# Patient Record
Sex: Male | Born: 1965 | Race: Black or African American | Hispanic: No | Marital: Married | Smoking: Current every day smoker
Health system: Southern US, Community
[De-identification: ages and names within clinical notes are randomized; demographics above are authoritative.]

## PROBLEM LIST (undated history)

## (undated) DIAGNOSIS — R040 Epistaxis: Secondary | ICD-10-CM

## (undated) DIAGNOSIS — I10 Essential (primary) hypertension: Secondary | ICD-10-CM

## (undated) DIAGNOSIS — E7800 Pure hypercholesterolemia, unspecified: Secondary | ICD-10-CM

## (undated) HISTORY — DX: Pure hypercholesterolemia, unspecified: E78.00

## (undated) HISTORY — DX: Epistaxis: R04.0

---

## 2006-05-12 ENCOUNTER — Emergency Department (HOSPITAL_COMMUNITY): Admission: EM | Admit: 2006-05-12 | Discharge: 2006-05-12 | Payer: Self-pay | Admitting: Emergency Medicine

## 2010-06-03 ENCOUNTER — Emergency Department (HOSPITAL_COMMUNITY)
Admission: EM | Admit: 2010-06-03 | Discharge: 2010-06-04 | Payer: Self-pay | Source: Home / Self Care | Admitting: Emergency Medicine

## 2012-04-25 ENCOUNTER — Emergency Department (HOSPITAL_COMMUNITY)
Admission: EM | Admit: 2012-04-25 | Discharge: 2012-04-25 | Disposition: A | Discharge status: Home / Self Care | Payer: BC Managed Care – PPO | Source: Home / Self Care | Attending: Family Medicine | Admitting: Family Medicine

## 2012-04-25 ENCOUNTER — Encounter (HOSPITAL_COMMUNITY): Payer: Self-pay | Admitting: *Deleted

## 2012-04-25 DIAGNOSIS — Z8709 Personal history of other diseases of the respiratory system: Secondary | ICD-10-CM

## 2012-04-25 DIAGNOSIS — Z87898 Personal history of other specified conditions: Secondary | ICD-10-CM

## 2012-04-25 DIAGNOSIS — I10 Essential (primary) hypertension: Secondary | ICD-10-CM

## 2012-04-25 MED ORDER — LISINOPRIL-HYDROCHLOROTHIAZIDE 10-12.5 MG PO TABS: 1.0000 | ORAL_TABLET | Freq: Every day | ORAL | Status: DC

## 2012-04-25 NOTE — ED Notes (Signed)
Pt  Reports  intermittant   Episodes  Over  Last  Few  Weeks  Of  Nosebleed    -  However  No  Nosebleed  X  6  Days  -  He  Also  Reports  Some  dizzyness  At  Times   As well  As  Pain in l  Arm  When  Moves

## 2012-04-25 NOTE — ED Provider Notes (Signed)
History     CSN: [id number]  Arrival date & time 04/25/12  1000   First MD Initiated Contact with Patient 04/25/12 1006      Chief Complaint  Patient presents with  . Epistaxis    (Consider location/radiation/quality/duration/timing/severity/associated sxs/prior treatment) Patient is a 46 y.o. male presenting with nosebleeds. The history is provided by the patient and the spouse.  Epistaxis  This is a new problem. The current episode started more than 1 week ago (off and on over past sev weeks.,none in1 week.). The problem has been resolved. The bleeding has been from the left nare. His past medical history does not include bleeding disorder, colds, nose-picking or frequent nosebleeds.    History reviewed. No pertinent past medical history.  History reviewed. No pertinent past surgical history.  History reviewed. No pertinent family history.  History  Substance Use Topics  . Smoking status: Not on file  . Smokeless tobacco: Not on file  . Alcohol Use: Not on file      Review of Systems  Constitutional: Negative.   HENT: Positive for nosebleeds.     Allergies  Review of patient's allergies indicates no known allergies.  Home Medications   Current Outpatient Rx  Name  Route  Sig  Dispense  Refill  . LISINOPRIL-HYDROCHLOROTHIAZIDE 10-12.5 MG PO TABS   Oral   Take 1 tablet by mouth daily.   30 tablet   1     BP 162/99  Pulse 108  Temp 97.9 F (36.6 C) (Oral)  Resp 20  SpO2 97%  Physical Exam  Nursing note and vitals reviewed. Constitutional: He is oriented to person, place, and time. He appears well-developed and well-nourished.  HENT:  Head: Normocephalic.  Right Ear: External ear normal.  Left Ear: External ear normal.  Nose: Mucosal edema and rhinorrhea present. No epistaxis.  Mouth/Throat: Oropharynx is clear and moist and mucous membranes are normal.  Cardiovascular: Normal rate, regular rhythm, normal heart sounds and intact distal pulses.     Pulmonary/Chest: Effort normal and breath sounds normal.  Neurological: He is alert and oriented to person, place, and time.  Skin: Skin is warm and dry.    ED Course  Procedures (including critical care time)  Labs Reviewed - No data to display No results found.   1. History of epistaxis   2. Hypertension, benign       MDM          Linna Hoff, MD 04/25/12 1121

## 2012-08-03 ENCOUNTER — Emergency Department (HOSPITAL_COMMUNITY): Payer: BC Managed Care – PPO

## 2012-08-03 ENCOUNTER — Emergency Department (HOSPITAL_COMMUNITY)
Admission: EM | Admit: 2012-08-03 | Discharge: 2012-08-03 | Disposition: A | Discharge status: Home / Self Care | Payer: BC Managed Care – PPO | Attending: Emergency Medicine | Admitting: Emergency Medicine

## 2012-08-03 ENCOUNTER — Encounter (HOSPITAL_COMMUNITY): Payer: Self-pay | Admitting: Emergency Medicine

## 2012-08-03 DIAGNOSIS — R5381 Other malaise: Secondary | ICD-10-CM | POA: Insufficient documentation

## 2012-08-03 DIAGNOSIS — I959 Hypotension, unspecified: Secondary | ICD-10-CM | POA: Insufficient documentation

## 2012-08-03 DIAGNOSIS — Z79899 Other long term (current) drug therapy: Secondary | ICD-10-CM | POA: Insufficient documentation

## 2012-08-03 DIAGNOSIS — F17200 Nicotine dependence, unspecified, uncomplicated: Secondary | ICD-10-CM | POA: Insufficient documentation

## 2012-08-03 DIAGNOSIS — I10 Essential (primary) hypertension: Secondary | ICD-10-CM | POA: Insufficient documentation

## 2012-08-03 DIAGNOSIS — R040 Epistaxis: Secondary | ICD-10-CM | POA: Insufficient documentation

## 2012-08-03 DIAGNOSIS — I951 Orthostatic hypotension: Secondary | ICD-10-CM

## 2012-08-03 HISTORY — DX: Essential (primary) hypertension: I10

## 2012-08-03 LAB — COMPREHENSIVE METABOLIC PANEL
ALT: 15 U/L (ref 0–53)
Albumin: 3.9 g/dL (ref 3.5–5.2)
CO2: 25 mEq/L (ref 19–32)
Creatinine, Ser: 1.08 mg/dL (ref 0.50–1.35)
GFR calc Af Amer: >90 mL/min (ref >90)
GFR calc non Af Amer: 79 mL/min — ABNORMAL LOW (ref >90)
Potassium: 3.8 mEq/L (ref 3.5–5.1)
Total Bilirubin: 0.3 mg/dL (ref 0.3–1.2)
Total Protein: 8 g/dL (ref 6.0–8.3)

## 2012-08-03 LAB — CBC WITH DIFFERENTIAL/PLATELET
Eosinophils Relative: 1 % (ref 0–5)
MCH: 29.8 pg (ref 26.0–34.0)
MCHC: 35.4 g/dL (ref 30.0–36.0)
MCV: 84.3 fL (ref 78.0–100.0)
Monocytes Relative: 6 % (ref 3–12)
Neutro Abs: 7.2 10*3/uL (ref 1.7–7.7)
Neutrophils Relative %: 64 % (ref 43–77)
Platelets: 295 10*3/uL (ref 150–400)
RDW: 16.3 % — ABNORMAL HIGH (ref 11.5–15.5)

## 2012-08-03 LAB — RAPID URINE DRUG SCREEN, HOSP PERFORMED: Tetrahydrocannabinol: NOT DETECTED

## 2012-08-03 LAB — URINALYSIS, ROUTINE W REFLEX MICROSCOPIC
Glucose, UA: NEGATIVE mg/dL
Hgb urine dipstick: NEGATIVE
Ketones, ur: NEGATIVE mg/dL
Nitrite: NEGATIVE
Protein, ur: 30 mg/dL — AB
Specific Gravity, Urine: 1.026 (ref 1.005–1.030)

## 2012-08-03 LAB — URINE MICROSCOPIC-ADD ON

## 2012-08-03 NOTE — ED Provider Notes (Signed)
History     CSN: [id number]  Arrival date & time 08/03/12  1229   First MD Initiated Contact with Patient 08/03/12 1305      Chief Complaint  Patient presents with  . Dizziness  . Epistaxis    (Consider location/radiation/quality/duration/timing/severity/associated sxs/prior treatment) HPI Comments: Patient c/o intermittent nosebleeds since November as well as intermittent lightheadedness since January 1 after he hit his head. Denies CP or SOB.  States his dizziness is worse with standing and better with sitting.  No nausea or vomiting. No focal weakness, numbness, tingling.   States he had nosebleeds on and off for the past few months.  Seem to have improved after being started on BP meds by urgent care.  The history is provided by the patient.    Past Medical History  Diagnosis Date  . Hypertension     History reviewed. No pertinent past surgical history.  History reviewed. No pertinent family history.  History  Substance Use Topics  . Smoking status: Current Every Day Smoker  . Smokeless tobacco: Not on file  . Alcohol Use: Yes      Review of Systems  Constitutional: Negative for fever, activity change and appetite change.  HENT: Positive for nosebleeds.   Respiratory: Negative for cough, chest tightness and shortness of breath.   Cardiovascular: Negative for chest pain.  Gastrointestinal: Negative for nausea, vomiting and abdominal pain.  Musculoskeletal: Negative for back pain.  Skin: Negative for rash.  Neurological: Positive for dizziness, weakness and light-headedness. Negative for numbness and headaches.  A complete 10 system review of systems was obtained and all systems are negative except as noted in the HPI and PMH.    Allergies  Review of patient's allergies indicates no known allergies.  Home Medications   Current Outpatient Rx  Name  Route  Sig  Dispense  Refill  . lisinopril-hydrochlorothiazide (PRINZIDE,ZESTORETIC) 10-12.5 MG per tablet  Oral   Take 1 tablet by mouth daily.   30 tablet   1     BP 140/93  Pulse 92  Temp(Src) 98.5 F (36.9 C) (Oral)  Resp 18  SpO2 97%  Physical Exam  Constitutional: He is oriented to person, place, and time. He appears well-developed and well-nourished. No distress.  HENT:  Head: Normocephalic and atraumatic.  Mouth/Throat: Oropharynx is clear and moist. No oropharyngeal exudate.  No evidence of recent epistaxis  Eyes: Conjunctivae and EOM are normal. Pupils are equal, round, and reactive to light.  Neck: Normal range of motion. Neck supple.  Cardiovascular: Normal rate, regular rhythm and normal heart sounds.   Pulmonary/Chest: Effort normal and breath sounds normal. No respiratory distress. He has no wheezes.  Abdominal: Soft. There is no tenderness. There is no rebound and no guarding.  Musculoskeletal: Normal range of motion. He exhibits no edema and no tenderness.  Neurological: He is alert and oriented to person, place, and time. No cranial nerve deficit. He exhibits normal muscle tone. Coordination normal.  cranial nerves 2-12 intact, 5 out of 5 strength throughout, no ataxia on finger to nose, no pronator drift, negative Romberg, normal gait  Skin: Skin is warm.    ED Course  Procedures (including critical care time)  Labs Reviewed  CBC WITH DIFFERENTIAL - Abnormal; Notable for the following:    WBC 11.2 (*)    RDW 16.3 (*)    All other components within normal limits  COMPREHENSIVE METABOLIC PANEL - Abnormal; Notable for the following:    Glucose, Bld 130 (*)  GFR calc non Af Amer 79 (*)    All other components within normal limits  URINALYSIS, ROUTINE W REFLEX MICROSCOPIC - Abnormal; Notable for the following:    Protein, ur 30 (*)    All other components within normal limits  URINE MICROSCOPIC-ADD ON - Abnormal; Notable for the following:    Casts HYALINE CASTS (*)    All other components within normal limits  TROPONIN I  URINE RAPID DRUG SCREEN (HOSP  PERFORMED)   Ct Head Wo Contrast  08/03/2012  *RADIOLOGY REPORT*  Clinical Data: Dizziness.  Epistaxis.  CT HEAD WITHOUT CONTRAST  Technique:  Contiguous axial images were obtained from the base of the skull through the vertex without contrast.  Comparison: None.  Findings: No mass lesion, mass effect, midline shift, hydrocephalus, hemorrhage.  No territorial ischemia or acute infarction.  There is near-complete opacification of the visualized portions of the right maxillary sinus.  Mucoperiosteal thickening of the left maxillary sinus.  Bilateral frontal sinus and scattered ethmoid sinus disease is also present.  Minimal sinus disease of the sphenoid sinuses.  Mastoid air cells are clear.  IMPRESSION: Negative CT brain.  Chronic-appearing paranasal sinus disease, worst in the right maxillary sinus.   Original Report Authenticated By: Andreas Newport, M.D.      No diagnosis found.    MDM  Patient complains of intermittent nosebleeds since Thanksgiving as well as intermittent dizziness since January 1. Denies any chest pain, shortness of breath, abdominal pain, nausea vomiting.  Neurological exam is nonfocal. Patient's blood pressure is acceptable. He looks well-hydrated and in no distress and nontoxic. Orthostatics positive by heart rate. No vomiting in the ED. Patient tolerating by mouth fluids. Workup unremarkable with normal electrolytes. Needs to establish care with PCP to address hypertension.   Date: 08/03/2012  Rate: 87  Rhythm: normal sinus rhythm  QRS Axis: normal  Intervals: normal  ST/T Wave abnormalities: normal  Conduction Disutrbances:none  Narrative Interpretation:   Old EKG Reviewed: none available    Glynn Octave, MD 08/03/12 1727

## 2012-08-03 NOTE — ED Notes (Signed)
Patient transported to CT 

## 2012-08-03 NOTE — ED Notes (Signed)
H/a and dizziness  X 2 months nose bleed for 2 months also

## 2012-08-03 NOTE — ED Notes (Signed)
Nosebleeds ongoing since November. Nosebleeds recurrent. Dizziness has increased over past few days, started after hitting head on New years.

## 2012-08-03 NOTE — ED Notes (Signed)
Pt placed on monitor, continuous pulse oximetry and blood pressure cuff; family at bedside 

## 2012-08-07 ENCOUNTER — Ambulatory Visit (INDEPENDENT_AMBULATORY_CARE_PROVIDER_SITE_OTHER): Payer: BC Managed Care – PPO | Admitting: Internal Medicine

## 2012-08-07 ENCOUNTER — Encounter: Payer: Self-pay | Admitting: Internal Medicine

## 2012-08-07 VITALS — BP 135/82 | HR 98 | Temp 97.2°F | Ht 72.0 in | Wt 192.8 lb

## 2012-08-07 DIAGNOSIS — F17200 Nicotine dependence, unspecified, uncomplicated: Secondary | ICD-10-CM

## 2012-08-07 DIAGNOSIS — R040 Epistaxis: Secondary | ICD-10-CM

## 2012-08-07 DIAGNOSIS — Z13220 Encounter for screening for lipoid disorders: Secondary | ICD-10-CM

## 2012-08-07 DIAGNOSIS — Z720 Tobacco use: Secondary | ICD-10-CM | POA: Insufficient documentation

## 2012-08-07 HISTORY — DX: Epistaxis: R04.0

## 2012-08-07 LAB — PROTIME-INR
INR: 0.87 (ref <1.50)
Prothrombin Time: 11.8 seconds (ref 11.6–15.2)

## 2012-08-07 LAB — LIPID PANEL
Cholesterol: 245 mg/dL — ABNORMAL HIGH (ref 0–200)
HDL: 46 mg/dL (ref >39)
Total CHOL/HDL Ratio: 5.3 Ratio

## 2012-08-07 LAB — APTT: aPTT: 29 seconds (ref 24–37)

## 2012-08-07 MED ORDER — SALINE SPRAY 0.65 % NA SOLN: 1.0000 | NASAL | Status: DC | PRN

## 2012-08-07 NOTE — Patient Instructions (Addendum)
General Instructions:  1) Have your blood pressure checked at work occasionally.  If top number consistently greater than 140 or bottom number greater than 80, give the clinic a call. 2) Use a saline nose spray into both nostrils daily.  If bleeding continues to be a problem, give the clinic a call. 3) Stop smoking. 4) Stop taking lisinopril/HCTZ. You do not need to be on blood pressure medicine right now.   Treatment Goals:  Goals (1 Years of Data) as of 08/07/12     Lifestyle    . Quit smoking / using tobacco       Progress Toward Treatment Goals:  Treatment Goal 08/07/2012  Stop smoking smoking the same amount

## 2012-08-07 NOTE — Assessment & Plan Note (Signed)
Assessment:  Progress toward smoking cessation:  smoking the same amount  Barriers to progress toward smoking cessation:  withdrawal symptoms  Plan:  Instruction/counseling given:  I counseled patient on the dangers of tobacco use and advised patient to stop smoking.  Educational resources provided:  smoking cessation resource list;[facility] (1-800-QUIT-NOW) brochure Patient agreed to the following self-care plans for smoking cessation:  go to the [employer] ([employer])

## 2012-08-07 NOTE — Progress Notes (Addendum)
Subjective:    Patient ID: Zachary Campos, male    DOB: 19-Aug-1965, 47 y.o.   MRN: [id number]  HPI:  47 year old man presenting as a new patient to clinic. Began experiencing nosebleeds in November 2013. Nosebleeds were occurring 2-3 times a day. He was seen in urgent care and diagnosed with high blood pressure. He was prescribed lisinopril/hydrochlorothiazide. This medicine caused nausea and dizziness. He was recently seen and the emergency department and diagnosed with orthostatic hypotension. He has not been taking his blood pressure medicine for the past week. Nosebleeds have decreased to 1 nosebleeds every other week. Nosebleeds last 10-15 minutes before the bleeding stops. Dizziness and nausea have resolved since stopping the high blood pressure medicine. No other symptoms.   Review of Systems  Constitutional: Negative.  Negative for fever and chills.  HENT: Positive for nosebleeds and congestion. Negative for hearing loss and ear pain.   Eyes: Negative.  Negative for visual disturbance.  Respiratory: Negative.  Negative for cough and shortness of breath.   Cardiovascular: Negative.  Negative for chest pain.  Gastrointestinal: Negative.  Negative for nausea, vomiting, abdominal pain, diarrhea, constipation and blood in stool.  Endocrine: Negative.  Negative for cold intolerance and heat intolerance.  Genitourinary: Negative for dysuria and hematuria.  Neurological: Negative.  Negative for dizziness, syncope, weakness, light-headedness, numbness and headaches.  Hematological: Negative.  Negative for adenopathy. Does not bruise/bleed easily.   Medical History No pertinent medical history  Surgical History No prior surgeries  Allergies No known drug allergies  Medications Not on any medications  Social History Social History  . Marital Status: Married    Spouse Name: N/A    Number of Children: 1  . Years of Education: N/A   Occupational History  . manufacturing    Social  History Main Topics  . Smoking status: Current Every Day Smoker -- 0.50 packs/day    Types: Cigarettes  . Smokeless tobacco: None  . Alcohol Use: Yes     Comment: averages 1 40oz beer every evening  . Drug Use: No  . Sexually Active: None   Social History Narrative   Works at a [employer], commissioning.    Family History  Problem Relation Age of Onset  . Bladder Cancer Mother   . Hypertension Mother   . Hyperlipidemia Mother   . Heart disease Neg Hx   . Stroke Neg Hx   . Diabetes Neg Hx     Family Status  Relation Status Death Age  . Mother Deceased 59  . Father Alive     age 51 (08/07/2012)  . Son Alive     age 35 (08/07/2012)        Objective:   Physical Exam:  GENERAL: well developed, well nourished; no acute distress HEAD: atraumatic, normocephalic EYES: pupils equal, round and reactive; sclera anicteric; normal conjunctiva EARS: canals patent and TMs normal bilaterally NOSE/THROAT: oropharynx clear, moist mucous membranes, pink gums, several teeth missing NECK: supple, thyroid normal in size and without palpable nodules LYMPH: no cervical or supraclavicular lymphadenopathy LUNGS: clear to auscultation bilaterally, normal work of breathing HEART: normal rate and regular rhythm; normal S1 and S2 without S3 or S4; no murmurs, rubs, or clicks PULSES: radial 2+ and symmetric ABDOMEN: soft, non-tender, normal bowel sounds MOTOR: 5 out of 5 dorsiflexion and grip strength bilaterally SENSATION: Intact, no deficits REFLEXES: 1+ patellar reflexes bilaterally CRANIAL NERVES: pupils reactive to light bilaterally; extra occular muscles are intact, smile is symmetric, uvula is midline and palate elevates  symmetrically, tongue protrudes midline. SKIN: warm, dry, intact, normal turgor, no rashes EXTREMITIES: no peripheral edema, clubbing, or cyanosis PSYCH: patient is alert and oriented, mood and affect are normal and congruent  Filed Vitals:   08/07/12 0841  BP:  135/82  Pulse: 98  Temp: 97.2 F (36.2 C)    BP Readings from Last 3 Encounters:  08/07/12 135/82  08/03/12 140/93  04/25/12 162/99       Assessment & Plan:   Lipid panel today for risk stratification. All other health maintenance and preventive measures up to date.  ADDENDUM: Lipid panel returned, showing hypercholesterolemia. 10 year or a vascular disease risk calculated at 9.7%. Will speak to the patient about starting pravastatin.  Lipid Panel     Component Value Date/Time   CHOL 245* 08/07/2012 0925   TRIG 287* 08/07/2012 0925   HDL 46 08/07/2012 0925   CHOLHDL 5.3 08/07/2012 0925   VLDL 57* 08/07/2012 0925   LDLCALC 142* 08/07/2012 1610

## 2012-08-07 NOTE — Assessment & Plan Note (Addendum)
Bleeding has decreased in frequency to once every other week. Bleeding stops and a normal amount of time. Do not suspect bleeding diathesis. We'll check PT and PTT today. Platelet count normal. Instructed patient to use saline nasal spray in both nares every day. If bleeding continues to be a problem, we will refer to ENT. - Saline nasal spray - PT and PTT  ADDENDUM: PT and PTT are normal.  Coagulation studies, 08/07/2012 Test Result  PT  11.8   INR  0.87   PTT  29

## 2012-08-08 ENCOUNTER — Encounter: Payer: Self-pay | Admitting: Internal Medicine

## 2012-08-08 ENCOUNTER — Telehealth: Payer: Self-pay | Admitting: Internal Medicine

## 2012-08-08 DIAGNOSIS — E7800 Pure hypercholesterolemia, unspecified: Secondary | ICD-10-CM

## 2012-08-08 DIAGNOSIS — E785 Hyperlipidemia, unspecified: Secondary | ICD-10-CM | POA: Insufficient documentation

## 2012-08-08 HISTORY — DX: Pure hypercholesterolemia, unspecified: E78.00

## 2012-08-08 MED ORDER — PRAVASTATIN SODIUM 40 MG PO TABS: 40.0000 mg | ORAL_TABLET | Freq: Every evening | ORAL | Status: DC

## 2012-08-08 NOTE — Telephone Encounter (Signed)
I called the patient regarding his recent lipid panel. His total cholesterol and LDL were both elevated. His calculated 10 year cardiovascular disease risk is 9.7%. I again encouraged him to stop smoking as this would drop his risk to 5.7%, but in the interim I suggested we start a cholesterol-lowering medicine. He agreed with this plan, and we will start treatment with pravastatin 40 mg daily. Recent liver function testing was normal.

## 2012-08-08 NOTE — Addendum Note (Signed)
Addended by: Gwynn Burly B on: 08/08/2012 08:19 AM   Modules accepted: Medications

## 2012-08-18 ENCOUNTER — Encounter: Payer: Self-pay | Admitting: Internal Medicine

## 2016-03-07 ENCOUNTER — Emergency Department (HOSPITAL_COMMUNITY): Payer: BC Managed Care – PPO

## 2016-03-07 ENCOUNTER — Encounter (HOSPITAL_COMMUNITY): Payer: Self-pay | Admitting: Emergency Medicine

## 2016-03-07 ENCOUNTER — Emergency Department (HOSPITAL_COMMUNITY)
Admission: EM | Admit: 2016-03-07 | Discharge: 2016-03-07 | Disposition: A | Discharge status: Home / Self Care | Payer: BC Managed Care – PPO | Attending: Emergency Medicine | Admitting: Emergency Medicine

## 2016-03-07 DIAGNOSIS — F17210 Nicotine dependence, cigarettes, uncomplicated: Secondary | ICD-10-CM | POA: Diagnosis not present

## 2016-03-07 DIAGNOSIS — R0602 Shortness of breath: Secondary | ICD-10-CM | POA: Diagnosis present

## 2016-03-07 DIAGNOSIS — J069 Acute upper respiratory infection, unspecified: Secondary | ICD-10-CM | POA: Insufficient documentation

## 2016-03-07 DIAGNOSIS — B9789 Other viral agents as the cause of diseases classified elsewhere: Secondary | ICD-10-CM

## 2016-03-07 LAB — RAPID STREP SCREEN (MED CTR MEBANE ONLY): STREPTOCOCCUS, GROUP A SCREEN (DIRECT): NEGATIVE

## 2016-03-07 MED ORDER — ALBUTEROL SULFATE (2.5 MG/3ML) 0.083% IN NEBU
5.0000 mg | INHALATION_SOLUTION | Freq: Once | RESPIRATORY_TRACT | Status: DC
  Filled 2016-03-07: qty 6

## 2016-03-07 MED ORDER — BENZONATATE 100 MG PO CAPS
100.0000 mg | ORAL_CAPSULE | Freq: Once | ORAL | Status: AC
  Administered 2016-03-07: 100 mg via ORAL
  Filled 2016-03-07: qty 1

## 2016-03-07 MED ORDER — BENZONATATE 100 MG PO CAPS: 100.0000 mg | ORAL_CAPSULE | Freq: Three times a day (TID) | ORAL | 0 refills | Status: AC

## 2016-03-07 NOTE — ED Notes (Signed)
Patient verbalized understanding of discharge instructions and denies any further needs or questions at this time. VS stable. Patient ambulatory with steady gait. RN escorted to ED entrance in wheelchair.   

## 2016-03-07 NOTE — ED Notes (Signed)
Pt transported to xray 

## 2016-03-07 NOTE — ED Triage Notes (Addendum)
Pt c/o couple days of fever, sore throat, cough, and shortness of breath. 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 Pt has tried over the counter medications without relief.

## 2016-03-07 NOTE — ED Provider Notes (Signed)
[facility]-EMERGENCY DEPT Provider Note   CSN: [id number] Arrival date & time: 03/07/16  1646     History   Chief Complaint Chief Complaint  Patient presents with  . Shortness of Breath    HPI Zachary Campos is a 50 y.o. male.  The history is provided by the patient and the spouse.  Cough  This is a new problem. The current episode started more than 2 days ago (5 days). The problem occurs hourly. The problem has been gradually worsening. The cough is productive of sputum (green, nonbloody). There has been no fever. Associated symptoms include chills, rhinorrhea, sore throat and shortness of breath. Pertinent negatives include no chest pain, no headaches and no myalgias. He is a smoker.    Past Medical History:  Diagnosis Date  . Epistaxis 08/07/2012  . Hypercholesterolemia 08/08/2012   Lipid panel 08/07/2012: Cholesterol 245, triglycerides 287, HDL 46, LDL 142 (10-yr CVD risk 9.7%).     Patient Active Problem List   Diagnosis Date Noted  . Hypercholesterolemia 08/08/2012  . Epistaxis 08/07/2012  . Tobacco use disorder 08/07/2012    History reviewed. No pertinent surgical history.     Home Medications    Prior to Admission medications   Medication Sig Start Date End Date Taking? Authorizing Provider  benzonatate (TESSALON) 100 MG capsule Take 1 capsule (100 mg total) by mouth 3 (three) times daily. 03/07/16 03/10/16  Horald Pollen, MD  pravastatin (PRAVACHOL) 40 MG tablet Take 1 tablet (40 mg total) by mouth every evening. 08/08/12 08/08/13  Lollie Sails, MD  sodium chloride (OCEAN) 0.65 % SOLN nasal spray Place 1 spray into the nose as needed (epistaxis). 08/07/12   Lollie Sails, MD    Family History Family History  Problem Relation Age of Onset  . Bladder Cancer Mother   . Hypertension Mother   . Hyperlipidemia Mother   . Heart disease Neg Hx   . Stroke Neg Hx   . Diabetes Neg Hx     Social History Social History  Substance Use Topics  . Smoking status:  Current Every Day Smoker    Packs/day: 0.50    Types: Cigarettes  . Smokeless tobacco: Never Used  . Alcohol use Yes     Comment: averages 1 40oz beer every evening     Allergies   Review of patient's allergies indicates no known allergies.   Review of Systems Review of Systems  Constitutional: Positive for chills. Negative for fever.  HENT: Positive for congestion, rhinorrhea, sinus pressure and sore throat. Negative for trouble swallowing and voice change.   Respiratory: Positive for cough and shortness of breath. Negative for chest tightness.   Cardiovascular: Negative for chest pain and leg swelling.  Gastrointestinal: Negative for abdominal pain, nausea and vomiting.  Genitourinary: Negative for flank pain.  Musculoskeletal: Negative for arthralgias, myalgias, neck pain and neck stiffness.  Skin: Negative for rash.  Neurological: Negative for headaches.  Psychiatric/Behavioral: Negative for confusion.     Physical Exam Updated Vital Signs BP 134/99   Pulse 80   Temp 97.8 F (36.6 C) (Oral)   Resp 21   Ht 5\' 11"  (1.803 m)   Wt 87.7 kg   SpO2 95%   BMI 26.97 kg/m   Physical Exam  Constitutional: He is oriented to person, place, and time. He appears well-developed and well-nourished. No distress.  Pleasant, cooperative, well-appearing  HENT:  Head: Normocephalic and atraumatic.  Erythematous streaking of posterior oropharynx, mildly boggy nasal turbinates. Symmetric palate rise, normal  voice  Eyes: Conjunctivae are normal. No scleral icterus.  Neck: Normal range of motion. Neck supple. No JVD present.  Cardiovascular: Normal rate and regular rhythm.   No murmur heard. Pulmonary/Chest: Effort normal and breath sounds normal. No respiratory distress.  Abdominal: Soft. He exhibits no distension. There is no tenderness.  Musculoskeletal: Normal range of motion. He exhibits no edema or tenderness.  Neurological: He is alert and oriented to person, place, and time.  He exhibits normal muscle tone. Coordination normal.  Skin: Skin is warm and dry. Capillary refill takes less than 2 seconds. No rash noted. He is not diaphoretic.  Psychiatric: He has a normal mood and affect.  Nursing note and vitals reviewed.    ED Treatments / Results  Labs (all labs ordered are listed, but only abnormal results are displayed) Labs Reviewed  RAPID STREP SCREEN (NOT AT [facility])  CULTURE, GROUP A STREP [facility])    EKG  EKG Interpretation  Date/Time:  Sunday March 07 2016 16:51:09 EDT Ventricular Rate:  108 PR Interval:  144 QRS Duration: 76 QT Interval:  326 QTC Calculation: 436 R Axis:   59 Text Interpretation:  Sinus tachycardia Otherwise normal ECG No significant change since last tracing Confirmed by FLOYD MD, DANIEL [phone]) on 03/07/2016 5:04:33 PM       Radiology Dg Chest 2 View  Result Date: 03/07/2016 CLINICAL DATA:  Fever, productive cough. EXAM: CHEST  2 VIEW COMPARISON:  Radiographs of June 03, 2010 and May 12, 2006. FINDINGS: The heart size and mediastinal contours are within normal limits. Prominent oval-shaped density is seen on lateral radiograph only ; no corresponding abnormality is seen on AP radiographs. No pneumothorax or pleural effusion is noted. The visualized skeletal structures are unremarkable. IMPRESSION: Prominent oval-shaped density seen on lateral radiograph. This may simply represent normal vascular marking, but it is not clearly identified on prior radiographs. This may represent focal infiltrate, but neoplasm cannot be excluded. CT scan of the chest is recommended for further evaluation. Electronically Signed   By: Lupita Raider, M.D.   On: 03/07/2016 17:32    Procedures Procedures (including critical care time)  Medications Ordered in ED Medications  benzonatate (TESSALON) capsule 100 mg (100 mg Oral Given 03/07/16 1819)     Initial Impression / Assessment and Plan / ED Course  I have reviewed the triage vital  signs and the nursing notes.  Pertinent labs & imaging results that were available during my care of the patient were reviewed by me and considered in my medical decision making (see chart for details).  Clinical Course   AN SCHNABEL is a 50 y.o. male with h/o current smoker who presents to ED for 5 days of cough productive of green phlegm, cold, congestion, chills, postnasal drip, sore throat, concerning for viral URI with cough. Normal voice, doubt RPA. No evidence of PTA on exam. 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 CXR without evidence of pneumonia, however, demonstrates lesion on lateral view that warrants outpatient CT scan. Told patient and his wife about this, they demonstrate understanding that this needs further imaging as outpatient. Advised to return to ER for chest pain, shortness of breath, fevers, or any other new, worse, or concerning symptoms. They demonstrate understanding of this and comfort with d/c home.  Pt condition, course, and discharge were discussed with attending physician Dr. Melene Plan.  Final Clinical Impressions(s) / ED Diagnoses   Final diagnoses:  Viral URI with cough    New Prescriptions Discharge Medication List as of 03/07/2016  7:22 PM    START taking these medications   Details  benzonatate (TESSALON) 100 MG capsule Take 1 capsule (100 mg total) by mouth 3 (three) times daily., Starting Sun 03/07/2016, Until Wed 03/10/2016, Print         Horald PollenAudrey Dagny Fiorentino, MD 03/08/16 [id number]    Melene Planan Floyd, DO 03/08/16 2106

## 2016-03-10 LAB — CULTURE, GROUP A STREP (THRC)

## 2017-02-28 ENCOUNTER — Encounter (HOSPITAL_COMMUNITY): Payer: Self-pay | Admitting: *Deleted

## 2017-02-28 ENCOUNTER — Emergency Department (HOSPITAL_COMMUNITY): Payer: BC Managed Care – PPO

## 2017-02-28 ENCOUNTER — Emergency Department (HOSPITAL_COMMUNITY)
Admission: EM | Admit: 2017-02-28 | Discharge: 2017-02-28 | Disposition: A | Discharge status: Home / Self Care | Payer: BC Managed Care – PPO | Attending: Emergency Medicine | Admitting: Emergency Medicine

## 2017-02-28 DIAGNOSIS — W19XXXA Unspecified fall, initial encounter: Secondary | ICD-10-CM

## 2017-02-28 DIAGNOSIS — S2231XA Fracture of one rib, right side, initial encounter for closed fracture: Secondary | ICD-10-CM | POA: Insufficient documentation

## 2017-02-28 DIAGNOSIS — F17210 Nicotine dependence, cigarettes, uncomplicated: Secondary | ICD-10-CM | POA: Insufficient documentation

## 2017-02-28 DIAGNOSIS — Y929 Unspecified place or not applicable: Secondary | ICD-10-CM | POA: Diagnosis not present

## 2017-02-28 DIAGNOSIS — Y939 Activity, unspecified: Secondary | ICD-10-CM | POA: Diagnosis not present

## 2017-02-28 DIAGNOSIS — W109XXA Fall (on) (from) unspecified stairs and steps, initial encounter: Secondary | ICD-10-CM | POA: Insufficient documentation

## 2017-02-28 DIAGNOSIS — Y999 Unspecified external cause status: Secondary | ICD-10-CM | POA: Insufficient documentation

## 2017-02-28 MED ORDER — HYDROCODONE-ACETAMINOPHEN 5-325 MG PO TABS: 1.0000 | ORAL_TABLET | Freq: Three times a day (TID) | ORAL | 0 refills | Status: AC | PRN

## 2017-02-28 MED ORDER — HYDROCODONE-ACETAMINOPHEN 5-325 MG PO TABS
1.0000 | ORAL_TABLET | Freq: Once | ORAL | Status: AC
  Administered 2017-02-28: 1 via ORAL
  Filled 2017-02-28: qty 1

## 2017-02-28 MED ORDER — ACETAMINOPHEN 500 MG PO TABS: 1000.0000 mg | ORAL_TABLET | Freq: Three times a day (TID) | ORAL | 0 refills | Status: AC

## 2017-02-28 MED ORDER — LIDOCAINE 5 % EX OINT: 1.0000 "application " | TOPICAL_OINTMENT | Freq: Three times a day (TID) | CUTANEOUS | 0 refills | Status: DC | PRN

## 2017-02-28 MED ORDER — IBUPROFEN 600 MG PO TABS: 600.0000 mg | ORAL_TABLET | Freq: Three times a day (TID) | ORAL | 0 refills | Status: DC | PRN

## 2017-02-28 NOTE — ED Provider Notes (Signed)
[facility] Provider Note   CSN: [id number] Arrival date & time: 02/28/17  0709     History   Chief Complaint Chief Complaint  Patient presents with  . Fall  . Rib Injury    HPI NICKOLI BAGHERI is a 51 y.o. male.  HPI  51 year old male presents to the emergency department with right rib throbbing pain following a mechanical fall 5 days ago. Patient reports trying to get a lawnmower up 2 steps when he missed a step while trying to walk up, slipped and fell onto his right chest. He reports significant pain since fall which is exacerbated with movement and palpation. Has not found any relief. He reports that he has not been able to sleep due to the pain. Denies any head trauma or loss of consciousness. Patient is not on any anticoagulation. He denies any substernal chest pain, shortness of breath, abdominal pain, other injuries. Denies any other physical complaints. Past Medical History:  Diagnosis Date  . Epistaxis 08/07/2012  . Hypercholesterolemia 08/08/2012   Lipid panel 08/07/2012: Cholesterol 245, triglycerides 287, HDL 46, LDL 142 (10-yr CVD risk 9.7%).     Patient Active Problem List   Diagnosis Date Noted  . Hypercholesterolemia 08/08/2012  . Epistaxis 08/07/2012  . Tobacco use disorder 08/07/2012    History reviewed. No pertinent surgical history.     Home Medications    Prior to Admission medications   Medication Sig Start Date End Date Taking? Authorizing Provider  pravastatin (PRAVACHOL) 40 MG tablet Take 1 tablet (40 mg total) by mouth every evening. 08/08/12 08/08/13  Lollie Sails, MD  sodium chloride (OCEAN) 0.65 % SOLN nasal spray Place 1 spray into the nose as needed (epistaxis). 08/07/12   Lollie Sails, MD    Family History Family History  Problem Relation Age of Onset  . Bladder Cancer Mother   . Hypertension Mother   . Hyperlipidemia Mother   . Heart disease Neg Hx   . Stroke Neg Hx   . Diabetes Neg Hx     Social History Social  History  Substance Use Topics  . Smoking status: Current Every Day Smoker    Packs/day: 0.50    Types: Cigarettes  . Smokeless tobacco: Never Used  . Alcohol use Yes     Comment: averages 1 40oz beer every evening     Allergies   Patient has no known allergies.   Review of Systems Review of Systems All other systems are reviewed and are negative for acute change except as noted in the HPI   Physical Exam Updated Vital Signs BP (!) 139/98   Pulse 98   Temp 98.3 F (36.8 C) (Oral)   Resp 14   Ht 6' (1.829 m)   Wt 88 kg (194 lb)   SpO2 97%   BMI 26.31 kg/m   Physical Exam  Constitutional: He is oriented to person, place, and time. He appears well-developed and well-nourished. No distress.  HENT:  Head: Normocephalic.  Right Ear: External ear normal.  Left Ear: External ear normal.  Mouth/Throat: Oropharynx is clear and moist.  Eyes: Pupils are equal, round, and reactive to light. Conjunctivae and EOM are normal. Right eye exhibits no discharge. Left eye exhibits no discharge. No scleral icterus.  Neck: Normal range of motion. Neck supple.  Cardiovascular: Regular rhythm and normal heart sounds.  Exam reveals no gallop and no friction rub.   No murmur heard. Pulses:      Radial pulses are 2+ on  the right side, and 2+ on the left side.       Dorsalis pedis pulses are 2+ on the right side, and 2+ on the left side.  Pulmonary/Chest: Effort normal and breath sounds normal. No stridor. No respiratory distress. He exhibits tenderness. He exhibits no crepitus and no swelling.    Ecchymosis to the right lateral aspect of the chest wall about ribs 8, 9, 10.  Abdominal: Soft. He exhibits no distension. There is no tenderness.  Musculoskeletal:       Cervical back: He exhibits no bony tenderness.       Thoracic back: He exhibits no bony tenderness.       Lumbar back: He exhibits no bony tenderness.  Clavicle stable. Chest stable to AP/Lat compression. Pelvis stable to Lat  compression. No obvious extremity deformity. No abdominal wall contusion.  Neurological: He is alert and oriented to person, place, and time. GCS eye subscore is 4. GCS verbal subscore is 5. GCS motor subscore is 6.  Moving all extremities   Skin: Skin is warm. He is not diaphoretic.     ED Treatments / Results  Labs (all labs ordered are listed, but only abnormal results are displayed) Labs Reviewed - No data to display  EKG  EKG Interpretation None       Radiology Dg Ribs Unilateral W/chest Right  Result Date: 02/28/2017 CLINICAL DATA:  Fall at home on steps x 4 days ago and landed onto rt lateral rib area has severe rt lateral chest wall pain, sob worsening since, smokes approx 5-6 cigs per day EXAM: RIGHT RIBS AND CHEST - 3+ VIEW COMPARISON:  06/03/2010 FINDINGS: Nondisplaced fracture of the right posterior ninth rib. Small expansile bone lesion involving the right lateral tenth rib likely reflecting old posttraumatic deformity versus fibrous dysplasia or an enchondroma. There is no evidence of pneumothorax or pleural effusion. Both lungs are clear. Heart size and mediastinal contours are within normal limits. IMPRESSION: Nondisplaced fracture of the right posterior ninth rib. Electronically Signed   By: Elige Ko   On: 02/28/2017 08:26    Procedures Procedures (including critical care time)  Medications Ordered in ED Medications  HYDROcodone-acetaminophen (NORCO/VICODIN) 5-325 MG per tablet 1 tablet (not administered)     Initial Impression / Assessment and Plan / ED Course  I have reviewed the triage vital signs and the nursing notes.  Pertinent labs & imaging results that were available during my care of the patient were reviewed by me and considered in my medical decision making (see chart for details).     Nondisplaced right rib Fracture with overlying ecchymosis. No pneumonia, pneumothorax, pleural effusion on plain film. Patient provided with oral pain  medicine in the emergency department and an incentive spirometer. Discussed optimizing his pain control medicine at home.  The patient is safe for discharge with strict return precautions.   Final Clinical Impressions(s) / ED Diagnoses   Final diagnoses:  None   Disposition: Discharge  Condition: Good  I have discussed the results, Dx and Tx plan with the patient who expressed understanding and agree(s) with the plan. Discharge instructions discussed at great length. The patient was given strict return precautions who verbalized understanding of the instructions. No further questions at time of discharge.    New Prescriptions   ACETAMINOPHEN (TYLENOL) 500 MG TABLET    Take 2 tablets (1,000 mg total) by mouth every 8 (eight) hours. Do not take more than 4000 mg of acetaminophen (Tylenol) in a 24-hour period. Please  note that other medicines that you may be prescribed may have Tylenol as well.   HYDROCODONE-ACETAMINOPHEN (NORCO/VICODIN) 5-325 MG TABLET    Take 1 tablet by mouth every 8 (eight) hours as needed for severe pain (That is not improved by your scheduled acetaminophen (Tylenol) and as needed Ibuprofen regimen). Please do not exceed 4000 mg of acetaminophen (Tylenol) a 24-hour period. Please note that he may be prescribed additional medicine that contains acetaminophen.   IBUPROFEN (ADVIL,MOTRIN) 600 MG TABLET    Take 1 tablet (600 mg total) by mouth every 8 (eight) hours as needed (It scheduled Tylenol does not help with pain).   LIDOCAINE (XYLOCAINE) 5 % OINTMENT    Apply 1 application topically 3 (three) times daily as needed. Over your right chest injury      Cardama, Amadeo Garnet, MD 02/28/17 [phone]

## 2017-02-28 NOTE — ED Triage Notes (Addendum)
Pt sts he fell over some steps while mowing the lawn, 4 days ago, hitting his right side, he has significant bruising to the right side, sts it hurts to take a deep breath or cough. Pt reports unable to sleetp due to pain x 3 nights. Pt is taking advil at home without relief.

## 2018-06-27 ENCOUNTER — Ambulatory Visit (HOSPITAL_COMMUNITY)
Admission: EM | Admit: 2018-06-27 | Discharge: 2018-06-27 | Disposition: A | Discharge status: Home / Self Care | Payer: BC Managed Care – PPO | Attending: Family Medicine | Admitting: Family Medicine

## 2018-06-27 ENCOUNTER — Encounter (HOSPITAL_COMMUNITY): Payer: Self-pay

## 2018-06-27 DIAGNOSIS — F17210 Nicotine dependence, cigarettes, uncomplicated: Secondary | ICD-10-CM | POA: Diagnosis not present

## 2018-06-27 DIAGNOSIS — E785 Hyperlipidemia, unspecified: Secondary | ICD-10-CM

## 2018-06-27 DIAGNOSIS — Z720 Tobacco use: Secondary | ICD-10-CM | POA: Insufficient documentation

## 2018-06-27 DIAGNOSIS — R040 Epistaxis: Secondary | ICD-10-CM | POA: Insufficient documentation

## 2018-06-27 DIAGNOSIS — I10 Essential (primary) hypertension: Secondary | ICD-10-CM | POA: Diagnosis not present

## 2018-06-27 DIAGNOSIS — F17200 Nicotine dependence, unspecified, uncomplicated: Secondary | ICD-10-CM

## 2018-06-27 MED ORDER — LISINOPRIL-HYDROCHLOROTHIAZIDE 10-12.5 MG PO TABS: 1.0000 | ORAL_TABLET | Freq: Every day | ORAL | 1 refills | Status: DC

## 2018-06-27 MED ORDER — ATORVASTATIN CALCIUM 20 MG PO TABS: 20.0000 mg | ORAL_TABLET | Freq: Every day | ORAL | 1 refills | Status: DC

## 2018-06-27 NOTE — Discharge Instructions (Signed)
Take 1 pill a day of the hypertension medicine and the cholesterol medication Take at bedtime Continue to reduce salt in your diet Call tomorrow to set up an appointment with the PCP sometime in the next month

## 2018-06-27 NOTE — ED Triage Notes (Signed)
Pt states his blood pressure has been elevated for the past week and having head ahces and nosebleeds

## 2018-06-27 NOTE — ED Provider Notes (Signed)
[facility]    CSN: [id number] Arrival date & time: 06/27/18  1606     History   Chief Complaint Chief Complaint  Patient presents with  . Hypertension    HPI Zachary Campos is a 53 y.o. male.   HPI  This gentleman was sent home from work on Friday because of a nosebleed and hypertension.  The nurse told him it was not safe for him to be doing his job duties with his hypertension.  He states that he has had elevated blood pressure off and on since the summertime.  He has not obtained treatment.  He does not have a PCP.  He states that hypertension heart disease do run in his family.  Generally feels well, only rarely will have a headache or nosebleed.  No dizziness, visual symptoms, neuro symptoms. He was diagnosed with hyperlipidemia in 2014.  He was placed on a statin medication.  He does not recall the diagnosis with the medication, he was lost to follow-up. He is a cigarette smoker. I explained to the patient that is a smoker with hypertension and untreated hyperlipidemia he was at much increased risk of cardiovascular disease.  His cardiovascular risk profile based on the Framingham calculator is 36.9% risk over 10 years.  I explained to the patient this is a very high risk and that he needs to modify his behavior and obtain regular medical care for follow-up. He is also a heavy drinker.  We discussed that the medical recommendation is that he not drink more than 2 beers a day (12 ounce and not 40 ounce)  Past Medical History:  Diagnosis Date  . Epistaxis 08/07/2012  . Hypercholesterolemia 08/08/2012   Lipid panel 08/07/2012: Cholesterol 245, triglycerides 287, HDL 46, LDL 142 (10-yr CVD risk 9.7%).   . Hypertension     Patient Active Problem List   Diagnosis Date Noted  . Essential hypertension 06/27/2018  . Hyperlipidemia 08/08/2012  . Epistaxis 08/07/2012  . Tobacco abuse 08/07/2012    History reviewed. No pertinent surgical history.     Home Medications     Prior to Admission medications   Medication Sig Start Date End Date Taking? Authorizing Provider  atorvastatin (LIPITOR) 20 MG tablet Take 1 tablet (20 mg total) by mouth daily. 06/27/18   Eustace Moore, MD  lisinopril-hydrochlorothiazide (ZESTORETIC) 10-12.5 MG tablet Take 1 tablet by mouth daily. 06/27/18   Eustace Moore, MD    Family History Family History  Problem Relation Age of Onset  . Bladder Cancer Mother   . Hypertension Mother   . Hyperlipidemia Mother   . Heart disease Neg Hx   . Stroke Neg Hx   . Diabetes Neg Hx     Social History Social History   Tobacco Use  . Smoking status: Current Every Day Smoker    Packs/day: 0.50    Types: Cigarettes  . Smokeless tobacco: Never Used  Substance Use Topics  . Alcohol use: Yes    Comment: averages 1 40oz beer every evening  . Drug use: No     Allergies   Patient has no known allergies.   Review of Systems Review of Systems  Constitutional: Negative for chills and fever.  HENT: Positive for nosebleeds. Negative for ear pain and sore throat.   Eyes: Negative for pain and visual disturbance.  Respiratory: Negative for cough and shortness of breath.   Cardiovascular: Negative for chest pain and palpitations.  Gastrointestinal: Negative for abdominal pain and vomiting.  Genitourinary:  Negative for dysuria and hematuria.  Musculoskeletal: Negative for arthralgias and back pain.  Skin: Negative for color change and rash.  Neurological: Positive for headaches. Negative for seizures and syncope.  All other systems reviewed and are negative.    Physical Exam Triage Vital Signs ED Triage Vitals  Enc Vitals Group     BP 06/27/18 1640 (!) 165/112     Pulse Rate 06/27/18 1640 (!) 105     Resp 06/27/18 1640 16     Temp 06/27/18 1640 97.8 F (36.6 C)     Temp Source 06/27/18 1640 Oral     SpO2 06/27/18 1640 97 %     Weight --      Height --      Head Circumference --      Peak Flow --      Pain Score  06/27/18 1641 3     Pain Loc --      Pain Edu? --      Excl. in GC? --    No data found.  Updated Vital Signs BP (!) 165/112 (BP Location: Right Arm)   Pulse (!) 105   Temp 97.8 F (36.6 C) (Oral)   Resp 16   SpO2 97%       Physical Exam Constitutional:      General: He is not in acute distress.    Appearance: Normal appearance. He is well-developed and normal weight.  HENT:     Head: Normocephalic and atraumatic.     Right Ear: Tympanic membrane and ear canal normal.     Left Ear: Tympanic membrane and ear canal normal.     Nose: Nose normal. No congestion.     Mouth/Throat:     Mouth: Mucous membranes are moist.  Eyes:     Conjunctiva/sclera: Conjunctivae normal.     Pupils: Pupils are equal, round, and reactive to light.  Neck:     Musculoskeletal: Normal range of motion.  Cardiovascular:     Rate and Rhythm: Tachycardia present.     Heart sounds: Normal heart sounds.     Comments: 1 ectopic beat and 60 seconds of auscultation Pulmonary:     Effort: Pulmonary effort is normal. No respiratory distress.     Breath sounds: Normal breath sounds. No rhonchi.  Abdominal:     General: There is no distension.     Palpations: Abdomen is soft.  Musculoskeletal: Normal range of motion.  Skin:    General: Skin is warm and dry.  Neurological:     General: No focal deficit present.     Mental Status: He is alert.  Psychiatric:        Mood and Affect: Mood normal.      UC Treatments / Results  Labs (all labs ordered are listed, but only abnormal results are displayed) Labs Reviewed - No data to display  EKG None  Radiology No results found.  Procedures Procedures (including critical care time)  Medications Ordered in UC Medications - No data to display  Initial Impression / Assessment and Plan / UC Course  I have reviewed the triage vital signs and the nursing notes.  Pertinent labs & imaging results that were available during my care of the patient were  reviewed by me and considered in my medical decision making (see chart for details).    See HPI for discussion Final Clinical Impressions(s) / UC Diagnoses   Final diagnoses:  Essential hypertension  Hyperlipidemia, unspecified hyperlipidemia type  Tobacco abuse  Epistaxis  Discharge Instructions     Take 1 pill a day of the hypertension medicine and the cholesterol medication Take at bedtime Continue to reduce salt in your diet Call tomorrow to set up an appointment with the PCP sometime in the next month    ED Prescriptions    Medication Sig Dispense Auth. Provider   lisinopril-hydrochlorothiazide (ZESTORETIC) 10-12.5 MG tablet Take 1 tablet by mouth daily. 30 tablet Eustace MooreNelson, Assyria Morreale Sue, MD   atorvastatin (LIPITOR) 20 MG tablet Take 1 tablet (20 mg total) by mouth daily. 30 tablet Eustace MooreNelson, Janyia Guion Sue, MD     Controlled Substance Prescriptions  Controlled Substance Registry consulted? Not Applicable   Eustace MooreNelson, Bronislaw Switzer Sue, MD 06/27/18 1725

## 2018-06-28 ENCOUNTER — Telehealth (HOSPITAL_COMMUNITY): Payer: Self-pay | Admitting: Emergency Medicine

## 2018-06-28 MED ORDER — LISINOPRIL-HYDROCHLOROTHIAZIDE 10-12.5 MG PO TABS: 1.0000 | ORAL_TABLET | Freq: Every day | ORAL | 1 refills | Status: DC

## 2018-06-28 MED ORDER — ATORVASTATIN CALCIUM 20 MG PO TABS: 20.0000 mg | ORAL_TABLET | Freq: Every day | ORAL | 1 refills | Status: DC

## 2018-06-28 NOTE — Telephone Encounter (Signed)
Pt called asking for medications to be sent to a different pharmacy due to issues with insurance at [place].  New prescriptions sent.

## 2018-07-06 ENCOUNTER — Observation Stay (HOSPITAL_COMMUNITY): Payer: BC Managed Care – PPO

## 2018-07-06 ENCOUNTER — Other Ambulatory Visit: Payer: Self-pay

## 2018-07-06 ENCOUNTER — Encounter (HOSPITAL_COMMUNITY): Payer: Self-pay | Admitting: *Deleted

## 2018-07-06 ENCOUNTER — Emergency Department (HOSPITAL_COMMUNITY): Payer: BC Managed Care – PPO

## 2018-07-06 ENCOUNTER — Observation Stay (HOSPITAL_COMMUNITY)
Admission: EM | Admit: 2018-07-06 | Discharge: 2018-07-07 | Disposition: A | Discharge status: Home / Self Care | Payer: BC Managed Care – PPO | Attending: Internal Medicine | Admitting: Internal Medicine

## 2018-07-06 DIAGNOSIS — Z791 Long term (current) use of non-steroidal anti-inflammatories (NSAID): Secondary | ICD-10-CM | POA: Insufficient documentation

## 2018-07-06 DIAGNOSIS — I10 Essential (primary) hypertension: Secondary | ICD-10-CM | POA: Diagnosis not present

## 2018-07-06 DIAGNOSIS — E785 Hyperlipidemia, unspecified: Secondary | ICD-10-CM | POA: Diagnosis present

## 2018-07-06 DIAGNOSIS — W1830XA Fall on same level, unspecified, initial encounter: Secondary | ICD-10-CM | POA: Diagnosis not present

## 2018-07-06 DIAGNOSIS — E871 Hypo-osmolality and hyponatremia: Secondary | ICD-10-CM | POA: Diagnosis not present

## 2018-07-06 DIAGNOSIS — N179 Acute kidney failure, unspecified: Secondary | ICD-10-CM

## 2018-07-06 DIAGNOSIS — F17200 Nicotine dependence, unspecified, uncomplicated: Secondary | ICD-10-CM | POA: Diagnosis present

## 2018-07-06 DIAGNOSIS — F17210 Nicotine dependence, cigarettes, uncomplicated: Secondary | ICD-10-CM | POA: Diagnosis not present

## 2018-07-06 DIAGNOSIS — Z79899 Other long term (current) drug therapy: Secondary | ICD-10-CM | POA: Insufficient documentation

## 2018-07-06 DIAGNOSIS — R55 Syncope and collapse: Secondary | ICD-10-CM | POA: Diagnosis present

## 2018-07-06 DIAGNOSIS — E7800 Pure hypercholesterolemia, unspecified: Secondary | ICD-10-CM | POA: Diagnosis not present

## 2018-07-06 DIAGNOSIS — I959 Hypotension, unspecified: Secondary | ICD-10-CM

## 2018-07-06 DIAGNOSIS — R51 Headache: Secondary | ICD-10-CM | POA: Diagnosis present

## 2018-07-06 DIAGNOSIS — R42 Dizziness and giddiness: Secondary | ICD-10-CM | POA: Diagnosis present

## 2018-07-06 DIAGNOSIS — Z720 Tobacco use: Secondary | ICD-10-CM | POA: Diagnosis present

## 2018-07-06 LAB — URINALYSIS, ROUTINE W REFLEX MICROSCOPIC
BILIRUBIN URINE: NEGATIVE
Glucose, UA: NEGATIVE mg/dL
Ketones, ur: NEGATIVE mg/dL
Nitrite: NEGATIVE
Protein, ur: NEGATIVE mg/dL
Specific Gravity, Urine: 1.004 — ABNORMAL LOW (ref 1.005–1.030)
pH: 5 (ref 5.0–8.0)

## 2018-07-06 LAB — CBC
HCT: 38.2 % — ABNORMAL LOW (ref 39.0–52.0)
Hemoglobin: 12.6 g/dL — ABNORMAL LOW (ref 13.0–17.0)
MCH: 29 pg (ref 26.0–34.0)
MCHC: 33 g/dL (ref 30.0–36.0)
MCV: 88 fL (ref 80.0–100.0)
Platelets: 175 10*3/uL (ref 150–400)
RBC: 4.34 MIL/uL (ref 4.22–5.81)
RDW: 17.2 % — ABNORMAL HIGH (ref 11.5–15.5)
WBC: 8.9 10*3/uL (ref 4.0–10.5)
nRBC: 0 % (ref 0.0–0.2)

## 2018-07-06 LAB — LIPID PANEL
CHOLESTEROL: 216 mg/dL — AB (ref 0–200)
HDL: 48 mg/dL (ref >40)
LDL Cholesterol: 128 mg/dL — ABNORMAL HIGH (ref 0–99)
Total CHOL/HDL Ratio: 4.5 RATIO
Triglycerides: 199 mg/dL — ABNORMAL HIGH (ref <150)
VLDL: 40 mg/dL (ref 0–40)

## 2018-07-06 LAB — BASIC METABOLIC PANEL
ANION GAP: 14 (ref 5–15)
BUN: 35 mg/dL — ABNORMAL HIGH (ref 6–20)
CO2: 22 mmol/L (ref 22–32)
Calcium: 8.5 mg/dL — ABNORMAL LOW (ref 8.9–10.3)
Chloride: 92 mmol/L — ABNORMAL LOW (ref 98–111)
Creatinine, Ser: 2.27 mg/dL — ABNORMAL HIGH (ref 0.61–1.24)
GFR calc Af Amer: 37 mL/min — ABNORMAL LOW (ref >60)
GFR calc non Af Amer: 32 mL/min — ABNORMAL LOW (ref >60)
GLUCOSE: 111 mg/dL — AB (ref 70–99)
Potassium: 3.6 mmol/L (ref 3.5–5.1)
Sodium: 128 mmol/L — ABNORMAL LOW (ref 135–145)

## 2018-07-06 LAB — TROPONIN I: Troponin I: <0.03 ng/mL (ref <0.03)

## 2018-07-06 MED ORDER — POLYVINYL ALCOHOL 1.4 % OP SOLN: 1.0000 [drp] | OPHTHALMIC | Status: DC | PRN

## 2018-07-06 MED ORDER — ACETAMINOPHEN 650 MG RE SUPP: 650.0000 mg | Freq: Four times a day (QID) | RECTAL | Status: DC | PRN

## 2018-07-06 MED ORDER — ATORVASTATIN CALCIUM 20 MG PO TABS
20.0000 mg | ORAL_TABLET | Freq: Every day | ORAL | Status: DC
  Administered 2018-07-06 – 2018-07-07 (×2): 20 mg via ORAL
  Filled 2018-07-06 (×2): qty 1

## 2018-07-06 MED ORDER — SODIUM CHLORIDE 0.9 % IV BOLUS
1000.0000 mL | Freq: Once | INTRAVENOUS | Status: AC
  Administered 2018-07-06: 1000 mL via INTRAVENOUS

## 2018-07-06 MED ORDER — SODIUM CHLORIDE 0.9 % IV SOLN
INTRAVENOUS | Status: DC
  Administered 2018-07-06 – 2018-07-07 (×4): via INTRAVENOUS

## 2018-07-06 MED ORDER — ONDANSETRON HCL 4 MG PO TABS: 4.0000 mg | ORAL_TABLET | Freq: Four times a day (QID) | ORAL | Status: DC | PRN

## 2018-07-06 MED ORDER — SODIUM CHLORIDE 0.9% FLUSH: 3.0000 mL | Freq: Two times a day (BID) | INTRAVENOUS | Status: DC

## 2018-07-06 MED ORDER — SODIUM CHLORIDE 0.9% FLUSH: 3.0000 mL | INTRAVENOUS | Status: DC | PRN

## 2018-07-06 MED ORDER — BISACODYL 5 MG PO TBEC: 5.0000 mg | DELAYED_RELEASE_TABLET | Freq: Every day | ORAL | Status: DC | PRN

## 2018-07-06 MED ORDER — ONDANSETRON HCL 4 MG/2ML IJ SOLN: 4.0000 mg | Freq: Four times a day (QID) | INTRAMUSCULAR | Status: DC | PRN

## 2018-07-06 MED ORDER — SODIUM CHLORIDE 0.9% FLUSH: 3.0000 mL | Freq: Once | INTRAVENOUS | Status: DC

## 2018-07-06 MED ORDER — ACETAMINOPHEN 325 MG PO TABS: 650.0000 mg | ORAL_TABLET | Freq: Four times a day (QID) | ORAL | Status: DC | PRN

## 2018-07-06 MED ORDER — SODIUM CHLORIDE 0.9 % IV SOLN: 250.0000 mL | INTRAVENOUS | Status: DC | PRN

## 2018-07-06 MED ORDER — LORAZEPAM 2 MG/ML IJ SOLN
1.0000 mg | Freq: Once | INTRAMUSCULAR | Status: AC
  Administered 2018-07-06: 1 mg via INTRAVENOUS
  Filled 2018-07-06: qty 1

## 2018-07-06 NOTE — ED Notes (Signed)
Called lab to add on Lipid panel as ordered

## 2018-07-06 NOTE — ED Notes (Signed)
Bed: WA25 Expected date:  Expected time:  Means of arrival:  Comments: [employer] [facility] [employer]

## 2018-07-06 NOTE — ED Notes (Signed)
Patient transported to MRI 

## 2018-07-06 NOTE — ED Notes (Signed)
Bed: WU98 Expected date:  Expected time:  Means of arrival:  Comments: 25

## 2018-07-06 NOTE — H&P (Addendum)
History and Physical  Claiborne Riggony R Gayler EXB:[id number] DOB: 1966-05-04 DOA: 07/06/2018  PCP: Patient, No Pcp Per Patient coming from: Home.   I [facility] personally briefly [facility]   Chief Complaint: lightheaded.   HPI: Claiborne Riggony R Bunner is a 53 y.o. male past medical history of epistaxis, hypercholesterolemia, hypertension who presents after a near syncope episode.  Patient reports history of headache over the last 2 to 3 weeks, he has been taking ibuprofen for the same.  He describes headache as constant frontal area.  He reports also history of epistaxis/last episode 1 week ago.  He went to the urgent care, for evaluation of headache and high blood pressure.  He was started on hydrochlorothiazide, lisinopril a week ago.  Today he was walking in his house when he felt weak and lightheaded and he fell down.  He denies loss of consciousness. He denies  chest pain shortness of breath prior to the episode.  He denies diarrhea, nausea or vomiting.  Denies chest pain or shortness of breath on exertion.  Patient also report lightheadedness the night prior to admission, he had to hold himself on to the wall  because he was very lightheaded.  Evaluation in the ED;  blood pressure 93/72, heart rate 102, sodium 128, chloride 92, BUN 35, creatinine 2.2, hemoglobin 12.6, troponin 0.03.  UA 11-20 white blood cell, Urolene cast present.  Chest x-ray; no acute disease.   Review of Systems: All systems reviewed and apart from history of presenting illness, are negative.  Past Medical History:  Diagnosis Date  . Epistaxis 08/07/2012  . Hypercholesterolemia 08/08/2012   Lipid panel 08/07/2012: Cholesterol 245, triglycerides 287, HDL 46, LDL 142 (10-yr CVD risk 9.7%).   . Hypertension    History reviewed. No pertinent surgical history. Social History:  reports that he has been smoking cigarettes. He has been smoking about 0.50 packs per day. He has never used smokeless  tobacco. He reports current alcohol use. He reports that he does not use drugs.   No Known Allergies  Family History  Problem Relation Age of Onset  . Bladder Cancer Mother   . Hypertension Mother   . Hyperlipidemia Mother   . Heart disease Neg Hx   . Stroke Neg Hx   . Diabetes Neg Hx     Prior to Admission medications   Medication Sig Start Date End Date Taking? Authorizing Provider  ibuprofen (ADVIL,MOTRIN) 200 MG tablet Take 400 mg by mouth every 6 (six) hours as needed for fever, headache or moderate pain.   Yes [provider]  lisinopril-hydrochlorothiazide (ZESTORETIC) 10-12.5 MG tablet Take 1 tablet by mouth daily. 06/28/18  Yes Eustace MooreNelson, Yvonne Sue, MD  polyvinyl alcohol (LIQUIFILM TEARS) 1.4 % ophthalmic solution Place 1 drop into both eyes as needed for dry eyes.   Yes [provider]  atorvastatin (LIPITOR) 20 MG tablet Take 1 tablet (20 mg total) by mouth daily. 06/28/18   Eustace MooreNelson, Yvonne Sue, MD   Physical Exam: Vitals:   07/06/18 0758 07/06/18 1100 07/06/18 1102 07/06/18 1103  BP: 93/72 102/78 109/74 109/74  Pulse: 75 (!) 102 90 90  Resp:  15  18  Temp: 97.6 F (36.4 C)     TempSrc: Oral     SpO2: 98% 95% 97% 97%  Weight:      Height:         General exam: Moderately built and nourished patient, lying comfortably supine on the gurney in  no obvious distress.  Head, eyes and ENT: Nontraumatic and normocephalic. Pupils equally reacting to light and accommodation. Oral mucosa moist.  Neck: Supple. No JVD, carotid bruit or thyromegaly.  Lymphatics: No lymphadenopathy.  Respiratory system: Clear to auscultation. No increased work of breathing.  Cardiovascular system: S1 and S2 heard, RRR. No JVD, murmurs, gallops, clicks or pedal edema.  Gastrointestinal system: Abdomen is nondistended, soft and nontender. Normal bowel sounds heard. No organomegaly or masses appreciated.  Central nervous system: Alert and oriented. No focal neurological  deficits.  Extremities: Symmetric 5 x 5 power. Peripheral pulses symmetrically felt.   Skin: No rashes or acute findings.  Musculoskeletal system: Negative exam.  Psychiatry: Pleasant and cooperative.   Labs on Admission:  Basic Metabolic Panel: Recent Labs  Lab 07/06/18 0755  NA 128*  K 3.6  CL 92*  CO2 22  GLUCOSE 111*  BUN 35*  CREATININE 2.27*  CALCIUM 8.5*   Liver Function Tests: No results for input(s): AST, ALT, ALKPHOS, BILITOT, PROT, ALBUMIN in the last 168 hours. No results for input(s): LIPASE, AMYLASE in the last 168 hours. No results for input(s): AMMONIA in the last 168 hours. CBC: Recent Labs  Lab 07/06/18 0755  WBC 8.9  HGB 12.6*  HCT 38.2*  MCV 88.0  PLT 175   Cardiac Enzymes: Recent Labs  Lab 07/06/18 0755  TROPONINI <0.03    BNP (last 3 results) No results for input(s): PROBNP in the last 8760 hours. CBG: No results for input(s): GLUCAP in the last 168 hours.  Radiological Exams on Admission: Dg Chest 2 View  Result Date: 07/06/2018 CLINICAL DATA:  Dizziness and hypertension.  Cigarette smoker. EXAM: CHEST - 2 VIEW COMPARISON:  PA and lateral chest 03/07/2016. FINDINGS: The lungs are clear. Heart size is normal. No pneumothorax or pleural effusion. No acute or focal bony abnormality. IMPRESSION: No acute disease. Electronically Signed   By: Drusilla Kannerhomas  Dalessio M.D.   On: 07/06/2018 09:35    EKG: Independently reviewed. Normal sinus rhythm.   Assessment/Plan Active Problems:   Tobacco abuse   Hyperlipidemia   Essential hypertension   Near syncope   AKI (acute kidney injury) (HCC)   Hyponatremia   Hypotension  1-Near syncope; Dizziness;  -Suspect related to hypovolemia and hypotension. He was recently started on BP medications.  -Will admit to telemetry , monitor for arrhythmia.  -IV Bolus , IV fluids.  -Due to multiples risk factor ; hypercholesterolemia, HTN and recent headaches will order MRI to rule out stroke.  -orthostatic  vitals ordered.  -cycle cardiac enzymes.   2-AKI; last cr per records 5 years ago was at 1. Cr on admission at 2.2. Suspect multifactorial related to ibuprofen use, HCTZ/Lisinopril.  Hold HCTZ/Lisinopril. Stop ibuprofen. Counseling provided to patient.  IV fluids.  Strict I and O.  If renal function doesn't improved, consider renal US.  Follow urine culture. No symptoms consistent with UTI>   3-HTN; currently SBP low. IV fluids.   4-Hypercholesterolemia; he has not been taking statins. check lipid panel.    5-tobacco use; counseling provided.   6-Hyponatremia; Related to renal failure, HCTZ use. IV fluids.   DVT Prophylaxis: scd.  Code Status: full code.  Family Communication: brother was at bedside.  Disposition Plan: admit under observation for evaluation near syncope and treatment of AKI.   Time spent: 75 minutes.      Alba CoryBelkys A  MD [facility]   If 7PM-7AM, please contact night-coverage [email or web address] Password [email or web address]  07/06/2018, 11:50 AM

## 2018-07-06 NOTE — ED Notes (Signed)
Bed: [facility] Expected date:  Expected time:  Means of arrival:  Comments: EMS-syncope 

## 2018-07-06 NOTE — ED Notes (Signed)
Ativan to be given only when MRI calls to let us know they are on the way. Report also called to [employer] RN

## 2018-07-06 NOTE — ED Provider Notes (Signed)
Zachary Campos Provider Note   CSN: [id number] Arrival date & time: 07/06/18  0730     History   Chief Complaint Chief Complaint  Patient presents with  . Loss of Consciousness    HPI Zachary Campos is a 53 y.o. male.  Patient is a 53 y/o AAM with PMH HTN, HLD who presents to the ED for syncope today. Patient reports he started lisinopril about 1 week ago. 2 days after starting lisinopril he began to feel lightheaded and dizzy with standing. He reports that he feels fine at rest but when he goes to stand up and then walk he feels off balance and has fallen several times. This morning, he states he felt fine until he stood up and walked about 15 feet. He then became dizzy again and fell to the floor on his hands and knees. Denies hitting head or losing consciousness. Reports feeling fine now. Was initially hypotensive but fluids have increased bp to 110/65. Denies chest pain, SOB, n/v/d, abdominal pain, blurry vision, headache.      Past Medical History:  Diagnosis Date  . Epistaxis 08/07/2012  . Hypercholesterolemia 08/08/2012   Lipid panel 08/07/2012: Cholesterol 245, triglycerides 287, HDL 46, LDL 142 (10-yr CVD risk 9.7%).   . Hypertension     Patient Active Problem List   Diagnosis Date Noted  . Near syncope 07/06/2018  . AKI (acute kidney injury) (HCC) 07/06/2018  . Hyponatremia 07/06/2018  . Hypotension 07/06/2018  . Essential hypertension 06/27/2018  . Hyperlipidemia 08/08/2012  . Epistaxis 08/07/2012  . Tobacco abuse 08/07/2012    History reviewed. No pertinent surgical history.      Home Medications    Prior to Admission medications   Medication Sig Start Date End Date Taking? Authorizing Provider  lisinopril-hydrochlorothiazide (ZESTORETIC) 10-12.5 MG tablet Take 1 tablet by mouth daily. 06/28/18  Yes Eustace MooreNelson, Yvonne Sue, MD  polyvinyl alcohol (LIQUIFILM TEARS) 1.4 % ophthalmic solution Place 1 drop into both eyes as needed for  dry eyes.   Yes [provider]  atorvastatin (LIPITOR) 20 MG tablet Take 1 tablet (20 mg total) by mouth daily. 06/28/18   Eustace MooreNelson, Yvonne Sue, MD    Family History Family History  Problem Relation Age of Onset  . Bladder Cancer Mother   . Hypertension Mother   . Hyperlipidemia Mother   . Heart disease Neg Hx   . Stroke Neg Hx   . Diabetes Neg Hx     Social History Social History   Tobacco Use  . Smoking status: Current Every Day Smoker    Packs/day: 0.50    Types: Cigarettes  . Smokeless tobacco: Never Used  Substance Use Topics  . Alcohol use: Yes    Comment: averages 1 40oz beer every evening  . Drug use: No     Allergies   Patient has no known allergies.   Review of Systems Review of Systems  Constitutional: Negative.   HENT: Negative for congestion, ear pain, rhinorrhea, sore throat and tinnitus.   Eyes: Negative for photophobia, pain and visual disturbance.  Respiratory: Negative for cough, shortness of breath and wheezing.   Cardiovascular: Negative for chest pain, palpitations and leg swelling.  Gastrointestinal: Negative for abdominal pain, diarrhea, nausea and vomiting.  Endocrine: Negative for polydipsia and polyuria.  Genitourinary: Negative for decreased urine volume, difficulty urinating and dysuria.  Musculoskeletal: Negative for arthralgias, back pain, gait problem, joint swelling, myalgias, neck pain and neck stiffness.  Skin: Negative for rash.  Allergic/Immunologic:  Negative for environmental allergies, food allergies and immunocompromised state.  Neurological: Positive for dizziness and light-headedness. Negative for tremors, seizures, syncope, facial asymmetry, speech difficulty, weakness, numbness and headaches.  Hematological: Negative for adenopathy. Does not bruise/bleed easily.  Psychiatric/Behavioral: Negative for confusion.     Physical Exam Updated Vital Signs BP (!) 141/79   Pulse 77   Temp 97.6 F (36.4 C) (Oral)   Resp  18   Ht 6' (1.829 m)   Wt 95.3 kg   SpO2 97%   BMI 28.48 kg/m   Physical Exam Vitals signs and nursing note reviewed.  Constitutional:      General: He is not in acute distress.    Appearance: Normal appearance. He is not ill-appearing, toxic-appearing or diaphoretic.  HENT:     Head: Normocephalic and atraumatic.     Right Ear: Tympanic membrane and ear canal normal.     Left Ear: Tympanic membrane and ear canal normal.     Nose: Nose normal. No congestion or rhinorrhea.     Mouth/Throat:     Mouth: Mucous membranes are moist.     Pharynx: Oropharynx is clear.  Eyes:     General: No scleral icterus.    Extraocular Movements: Extraocular movements intact.     Conjunctiva/sclera: Conjunctivae normal.     Pupils: Pupils are equal, round, and reactive to light.  Neck:     Musculoskeletal: No neck rigidity.  Cardiovascular:     Rate and Rhythm: Normal rate and regular rhythm.     Pulses: Normal pulses.     Heart sounds: No murmur.  Pulmonary:     Effort: Pulmonary effort is normal. No respiratory distress.     Breath sounds: Normal breath sounds.  Chest:     Chest wall: No tenderness.  Abdominal:     General: Abdomen is flat. Bowel sounds are normal. There is no distension.     Tenderness: There is no abdominal tenderness. There is no guarding.  Musculoskeletal: Normal range of motion.  Skin:    Capillary Refill: Capillary refill takes less than 2 seconds.     Comments: Very superficial abrasions bilateral anterior knee, less than 1cm  Neurological:     General: No focal deficit present.     Mental Status: He is alert. Mental status is at baseline.     Sensory: Sensation is intact.     Coordination: Coordination is intact. Finger-Nose-Finger Test normal.  Psychiatric:        Mood and Affect: Mood normal.        Behavior: Behavior normal.      ED Treatments / Results  Labs (all labs ordered are listed, but only abnormal results are displayed) Labs Reviewed  BASIC  METABOLIC PANEL - Abnormal; Notable for the following components:      Result Value   Sodium 128 (*)    Chloride 92 (*)    Glucose, Bld 111 (*)    BUN 35 (*)    Creatinine, Ser 2.27 (*)    Calcium 8.5 (*)    GFR calc non Af Amer 32 (*)    GFR calc Af Amer 37 (*)    All other components within normal limits  CBC - Abnormal; Notable for the following components:   Hemoglobin 12.6 (*)    HCT 38.2 (*)    RDW 17.2 (*)    All other components within normal limits  URINALYSIS, ROUTINE W REFLEX MICROSCOPIC - Abnormal; Notable for the following components:   APPearance HAZY (*)  Specific Gravity, Urine 1.004 (*)    Hgb urine dipstick SMALL (*)    Leukocytes, UA MODERATE (*)    Bacteria, UA RARE (*)    All other components within normal limits  LIPID PANEL - Abnormal; Notable for the following components:   Cholesterol 216 (*)    Triglycerides 199 (*)    LDL Cholesterol 128 (*)    All other components within normal limits  URINE CULTURE  TROPONIN I  CBG MONITORING, ED    EKG EKG Interpretation  Date/Time:  Thursday July 06 2018 07:44:57 EST Ventricular Rate:  79 PR Interval:  142 QRS Duration: 86 QT Interval:  404 QTC Calculation: 463 R Axis:   79 Text Interpretation:  Normal sinus rhythm Artifact Otherwise within normal limits No significant change since last tracing Borderline ECG Confirmed by Gerhard Munch [phone]) on 07/06/2018 7:53:15 AM   Radiology Dg Chest 2 View  Result Date: 07/06/2018 CLINICAL DATA:  Dizziness and hypertension.  Cigarette smoker. EXAM: CHEST - 2 VIEW COMPARISON:  PA and lateral chest 03/07/2016. FINDINGS: The lungs are clear. Heart size is normal. No pneumothorax or pleural effusion. No acute or focal bony abnormality. IMPRESSION: No acute disease. Electronically Signed   By: Drusilla Kanner M.D.   On: 07/06/2018 09:35    Procedures Procedures (including critical care time)  Medications Ordered in ED Medications  sodium chloride flush  (NS) 0.9 % injection 3 mL (0 mLs Intravenous Hold 07/06/18 0842)  0.9 %  sodium chloride infusion ( Intravenous New Bag/Given 07/06/18 1243)  sodium chloride 0.9 % bolus 1,000 mL (1,000 mLs Intravenous New Bag/Given 07/06/18 1127)     Initial Impression / Assessment and Plan / ED Course  I have reviewed the triage vital signs and the nursing notes.  Pertinent labs & imaging results that were available during my care of the patient were reviewed by me and considered in my medical decision making (see chart for details).  Clinical Course as of Jul 06 1302  Thu Jul 06, 2018  9528 Patient with symptoms consistent with orthostatic hypotension in the context of recently starting new BP med (lisinopril).    [KM]  [phone] I reassesed the patient at this time. He states he feels "[place]!".    [KM]  1037 Patient labs reveal ARF with creatinin of 2.27. Patient also hyponatremic at 128. This may be multifactorial due to hypotension, alcohol abuse. Patient agreeable to staying for further workup and treatment. Urinalysis showing rare bacter, WBC, leuk and hgb. Patient asymptomatic. Urine cultured. Hospitalist consult pending.    [KM]    Clinical Course User Index [KM] Arlyn Dunning, PA-C    CRITICAL CARE Performed by: Arlyn Dunning   Total critical care time: 45 minutes  Critical care time was exclusive of separately billable procedures and treating other patients.  Critical care was necessary to treat or prevent imminent or life-threatening deterioration.  Critical care was time spent personally by me on the following activities: development of treatment plan with patient and/or surrogate as well as nursing, discussions with consultants, evaluation of patient's response to treatment, examination of patient, obtaining history from patient or surrogate, ordering and performing treatments and interventions, ordering and review of laboratory studies, ordering and review of radiographic studies, pulse  oximetry and re-evaluation of patient's condition.   Final Clinical Impressions(s) / ED Diagnoses   Final diagnoses:  Hypotension, unspecified hypotension type  Acute renal failure, unspecified acute renal failure type [facility])    ED Discharge Orders  None       Jeral PinchMcLean, Kenny Stern A, PA-C 07/06/18 1304    Gerhard MunchLockwood, Robert, MD 07/06/18 1525
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
## 2018-07-07 DIAGNOSIS — E785 Hyperlipidemia, unspecified: Secondary | ICD-10-CM | POA: Diagnosis not present

## 2018-07-07 DIAGNOSIS — R55 Syncope and collapse: Principal | ICD-10-CM

## 2018-07-07 DIAGNOSIS — I959 Hypotension, unspecified: Secondary | ICD-10-CM | POA: Diagnosis not present

## 2018-07-07 DIAGNOSIS — I10 Essential (primary) hypertension: Secondary | ICD-10-CM | POA: Diagnosis not present

## 2018-07-07 DIAGNOSIS — N179 Acute kidney failure, unspecified: Secondary | ICD-10-CM

## 2018-07-07 LAB — COMPREHENSIVE METABOLIC PANEL
ALT: 35 U/L (ref 0–44)
AST: 37 U/L (ref 15–41)
Albumin: 3.4 g/dL — ABNORMAL LOW (ref 3.5–5.0)
Alkaline Phosphatase: 39 U/L (ref 38–126)
Anion gap: 8 (ref 5–15)
BUN: 23 mg/dL — ABNORMAL HIGH (ref 6–20)
CO2: 24 mmol/L (ref 22–32)
Calcium: 8.5 mg/dL — ABNORMAL LOW (ref 8.9–10.3)
Chloride: 103 mmol/L (ref 98–111)
Creatinine, Ser: 1.15 mg/dL (ref 0.61–1.24)
GFR calc Af Amer: >60 mL/min (ref >60)
GFR calc non Af Amer: >60 mL/min (ref >60)
Glucose, Bld: 162 mg/dL — ABNORMAL HIGH (ref 70–99)
Potassium: 4 mmol/L (ref 3.5–5.1)
SODIUM: 135 mmol/L (ref 135–145)
Total Bilirubin: 0.7 mg/dL (ref 0.3–1.2)
Total Protein: 6.5 g/dL (ref 6.5–8.1)

## 2018-07-07 LAB — TROPONIN I
Troponin I: <0.03 ng/mL (ref <0.03)
Troponin I: <0.03 ng/mL (ref <0.03)

## 2018-07-07 LAB — CBC
HEMATOCRIT: 37.3 % — AB (ref 39.0–52.0)
Hemoglobin: 11.9 g/dL — ABNORMAL LOW (ref 13.0–17.0)
MCH: 29 pg (ref 26.0–34.0)
MCHC: 31.9 g/dL (ref 30.0–36.0)
MCV: 91 fL (ref 80.0–100.0)
Platelets: 138 10*3/uL — ABNORMAL LOW (ref 150–400)
RBC: 4.1 MIL/uL — ABNORMAL LOW (ref 4.22–5.81)
RDW: 17.7 % — ABNORMAL HIGH (ref 11.5–15.5)
WBC: 7.3 10*3/uL (ref 4.0–10.5)
nRBC: 0 % (ref 0.0–0.2)

## 2018-07-07 LAB — URINE CULTURE: Culture: <10000 — AB

## 2018-07-07 LAB — HIV ANTIBODY (ROUTINE TESTING W REFLEX): HIV Screen 4th Generation wRfx: NONREACTIVE

## 2018-07-07 MED ORDER — AMLODIPINE BESYLATE 5 MG PO TABS
2.5000 mg | ORAL_TABLET | Freq: Every day | ORAL | Status: DC
  Administered 2018-07-07: 2.5 mg via ORAL
  Filled 2018-07-07: qty 1

## 2018-07-07 MED ORDER — AMLODIPINE BESYLATE 2.5 MG PO TABS: 2.5000 mg | ORAL_TABLET | Freq: Every day | ORAL | 0 refills | Status: DC

## 2018-07-10 NOTE — Discharge Summary (Signed)
Physician Discharge Summary  Zachary Riggony R [place] AVW:[id number] DOB: December 26, 1965 DOA: 07/06/2018  PCP: Patient, No Pcp Per  Admit date: 07/06/2018 Discharge date: 07/10/2018  Admitted From: Home  Disposition:  Home   Recommendations for Outpatient Follow-up:  1. Follow up with PCP in 1-2 weeks 2. Please obtain BMP/CBC in one week  Discharge Condition: guarded.  CODE STATUS: full code.  Diet recommendation: Heart Healthy   Brief/Interim Summary: Zachary Campos is a 53 y.o. male past medical history of epistaxis, hypercholesterolemia, hypertension who presents after a near syncope episode.      Discharge Diagnoses:  Active Problems:   Tobacco abuse   Hyperlipidemia   Essential hypertension   Near syncope   AKI (acute kidney injury) (HCC)   Hyponatremia   Hypotension  Near syncope probably secondary to hypotension and hypovolemia. He was recentely started on lisinopril and hctz, which were discontinued on admission.  Low dose amlodipine started for hypertension.  Orthostatic vital signs were negative.  Cardiac enzymes were negative.  MRI negative for acute stroke.    His symptoms have resolved.     Hyperlipidemia:  Resume statin after discussing with PCP.    AKI:  Probably from dehydration and hypotension,.  After adequate hydration, creatinine has improved.    Discharge Instructions  Discharge Instructions    Diet - low sodium heart healthy   Complete by:  As directed    Discharge instructions   Complete by:  As directed    Please follow up with PCP in one week.     Allergies as of 07/07/2018   No Known Allergies     Medication List    STOP taking these medications   lisinopril-hydrochlorothiazide 10-12.5 MG tablet Commonly known as:  ZESTORETIC     TAKE these medications   amLODipine 2.5 MG tablet Commonly known as:  NORVASC Take 1 tablet (2.5 mg total) by mouth daily.   atorvastatin 20 MG tablet Commonly known as:  LIPITOR Take 1 tablet (20 mg  total) by mouth daily.   polyvinyl alcohol 1.4 % ophthalmic solution Commonly known as:  LIQUIFILM TEARS Place 1 drop into both eyes as needed for dry eyes.       No Known Allergies  Consultations:  None.    Procedures/Studies: Dg Chest 2 View  Result Date: 07/06/2018 CLINICAL DATA:  Dizziness and hypertension.  Cigarette smoker. EXAM: CHEST - 2 VIEW COMPARISON:  PA and lateral chest 03/07/2016. FINDINGS: The lungs are clear. Heart size is normal. No pneumothorax or pleural effusion. No acute or focal bony abnormality. IMPRESSION: No acute disease. Electronically Signed   By: Drusilla Kannerhomas  Dalessio M.D.   On: 07/06/2018 09:35   Mr Brain Wo Contrast  Result Date: 07/06/2018 CLINICAL DATA:  Vertigo.  Near syncope.  Headache for 2-3 weeks. EXAM: MRI HEAD WITHOUT CONTRAST TECHNIQUE: Multiplanar, multiecho pulse sequences of the brain and surrounding structures were obtained without intravenous contrast. COMPARISON:  Head CT 08/03/2012 FINDINGS: Multiple sequences are moderately motion degraded. Brain: There is no evidence of acute infarct, intracranial hemorrhage, mass, midline shift, or extra-axial fluid collection. There is mild cerebral atrophy. Minimal hazy FLAIR hyperintensity most notable in the periatrial white matter bilaterally is nonspecific and not necessarily abnormal for age. Vascular: Major intracranial vascular flow voids are preserved. Skull and upper cervical spine: Unremarkable bone marrow signal. Sinuses/Orbits: Unremarkable orbits. Moderate mucosal thickening in the paranasal sinuses bilaterally. Clear mastoid air cells. Other: None. IMPRESSION: 1. Motion degraded examination without evidence of acute intracranial abnormality. 2. Mild  cerebral atrophy. Electronically Signed   By: Sebastian Ache M.D.   On: 07/06/2018 16:06       Subjective:  No complaints.  Discharge Exam: Vitals:   07/07/18 0558 07/07/18 1359  BP: (!) 149/89 (!) 154/96  Pulse: 75 77  Resp: 16 16  Temp:  98.4 F (36.9 C) (!) 97.5 F (36.4 C)  SpO2: 96% 97%   Vitals:   07/06/18 1757 07/07/18 0558 07/07/18 0600 07/07/18 1359  BP: 135/86 (!) 149/89  (!) 154/96  Pulse: 97 75  77  Resp: 18 16  16   Temp: 98.4 F (36.9 C) 98.4 F (36.9 C)  (!) 97.5 F (36.4 C)  TempSrc: Oral Oral  Oral  SpO2: 96% 96%  97%  Weight:   81.2 kg   Height:        General: Pt is alert, awake, not in acute distress Cardiovascular: RRR, S1/S2 +, no rubs, no gallops Respiratory: CTA bilaterally, no wheezing, no rhonchi Abdominal: Soft, NT, ND, bowel sounds + Extremities: no edema, no cyanosis    The results of significant diagnostics from this hospitalization (including imaging, microbiology, ancillary and laboratory) are listed below for reference.     Microbiology: Recent Results (from the past 240 hour(s))  Urine culture     Status: Abnormal   Collection Time: 07/06/18  8:41 AM  Result Value Ref Range Status   Specimen Description URINE, CLEAN CATCH  Final   Special Requests   Final    NONE Performed at [facility], [address] [place], [place] [address]    Culture <10,000 COLONIES/mL INSIGNIFICANT GROWTH (A)  Final   Report Status 07/07/2018 FINAL  Final     Labs: BNP (last 3 results) No results for input(s): BNP in the last 8760 hours. Basic Metabolic Panel: Recent Labs  Lab 07/06/18 0755 07/07/18 0611  NA 128* 135  K 3.6 4.0  CL 92* 103  CO2 22 24  GLUCOSE 111* 162*  BUN 35* 23*  CREATININE 2.27* 1.15  CALCIUM 8.5* 8.5*   Liver Function Tests: Recent Labs  Lab 07/07/18 0611  AST 37  ALT 35  ALKPHOS 39  BILITOT 0.7  PROT 6.5  ALBUMIN 3.4*   No results for input(s): LIPASE, AMYLASE in the last 168 hours. No results for input(s): AMMONIA in the last 168 hours. CBC: Recent Labs  Lab 07/06/18 0755 07/07/18 0611  WBC 8.9 7.3  HGB 12.6* 11.9*  HCT 38.2* 37.3*  MCV 88.0 91.0  PLT 175 138*   Cardiac Enzymes: Recent Labs  Lab 07/06/18 0755  07/06/18 1816 07/06/18 2333 07/07/18 0611  TROPONINI <0.03 <0.03 <0.03 <0.03   BNP: Invalid input(s): POCBNP CBG: No results for input(s): GLUCAP in the last 168 hours. D-Dimer No results for input(s): DDIMER in the last 72 hours. Hgb A1c No results for input(s): HGBA1C in the last 72 hours. Lipid Profile No results for input(s): CHOL, HDL, LDLCALC, TRIG, CHOLHDL, LDLDIRECT in the last 72 hours. Thyroid function studies No results for input(s): TSH, T4TOTAL, T3FREE, THYROIDAB in the last 72 hours.  Invalid input(s): FREET3 Anemia work up No results for input(s): VITAMINB12, FOLATE, FERRITIN, TIBC, IRON, RETICCTPCT in the last 72 hours. Urinalysis    Component Value Date/Time   COLORURINE YELLOW 07/06/2018 0841   APPEARANCEUR HAZY (A) 07/06/2018 0841   LABSPEC 1.004 (L) 07/06/2018 0841   PHURINE 5.0 07/06/2018 0841   GLUCOSEU NEGATIVE 07/06/2018 0841   HGBUR SMALL (A) 07/06/2018 0841   BILIRUBINUR NEGATIVE 07/06/2018  0841   KETONESUR NEGATIVE 07/06/2018 0841   PROTEINUR NEGATIVE 07/06/2018 0841   UROBILINOGEN 0.2 08/03/2012 1419   NITRITE NEGATIVE 07/06/2018 0841   LEUKOCYTESUR MODERATE (A) 07/06/2018 0841   Sepsis Labs Invalid input(s): PROCALCITONIN,  WBC,  LACTICIDVEN Microbiology Recent Results (from the past 240 hour(s))  Urine culture     Status: Abnormal   Collection Time: 07/06/18  8:41 AM  Result Value Ref Range Status   Specimen Description URINE, CLEAN CATCH  Final   Special Requests   Final    NONE Performed at [facility], [address], [place], [place] [address]    Culture <10,000 COLONIES/mL INSIGNIFICANT GROWTH (A)  Final   Report Status 07/07/2018 FINAL  Final     Time coordinating discharge:35  minutes  SIGNED:   Kathlen Mody, MD  [facility] 07/10/2018, 9:32 AM Pager   If 7PM-7AM, please contact night-coverage [email or web address] Password [email or web address]

## 2018-08-07 ENCOUNTER — Encounter

## 2018-08-07 ENCOUNTER — Ambulatory Visit (INDEPENDENT_AMBULATORY_CARE_PROVIDER_SITE_OTHER): Payer: BC Managed Care – PPO | Admitting: Family Medicine

## 2018-08-07 ENCOUNTER — Encounter: Payer: Self-pay | Admitting: Family Medicine

## 2018-08-07 VITALS — BP 144/79 | HR 106 | Resp 17 | Ht 72.0 in | Wt 182.0 lb

## 2018-08-07 DIAGNOSIS — R000 Tachycardia, unspecified: Secondary | ICD-10-CM

## 2018-08-07 DIAGNOSIS — N179 Acute kidney failure, unspecified: Secondary | ICD-10-CM

## 2018-08-07 DIAGNOSIS — R7309 Other abnormal glucose: Secondary | ICD-10-CM | POA: Diagnosis not present

## 2018-08-07 DIAGNOSIS — E785 Hyperlipidemia, unspecified: Secondary | ICD-10-CM | POA: Diagnosis not present

## 2018-08-07 DIAGNOSIS — I10 Essential (primary) hypertension: Secondary | ICD-10-CM | POA: Diagnosis not present

## 2018-08-07 DIAGNOSIS — F17200 Nicotine dependence, unspecified, uncomplicated: Secondary | ICD-10-CM

## 2018-08-07 DIAGNOSIS — Z7689 Persons encountering health services in other specified circumstances: Secondary | ICD-10-CM

## 2018-08-07 MED ORDER — CARVEDILOL 3.125 MG PO TABS: 3.1250 mg | ORAL_TABLET | Freq: Two times a day (BID) | ORAL | 3 refills | Status: DC

## 2018-08-07 NOTE — Patient Instructions (Addendum)
Thank you for choosing Primary Care at [facility] to be your medical home!    Zachary Campos was seen by Joaquin Courts, FNP today.   Zachary Campos's primary care provider is Bing Neighbors, FNP   .   For the best care possible, you should try to see Joaquin Courts, FNP-C whenever you come to the clinic.   We look forward to seeing you again soon!  If you have any questions about your visit today, please call us at [phone] or feel free to reach your primary care provider via MyChart.      Steps to Quit Smoking  Smoking tobacco can be bad for your health. It can also affect almost every organ in your body. Smoking puts you and people around you at risk for many serious long-lasting (chronic) diseases. Quitting smoking is hard, but it is one of the best things that you can do for your health. It is never too late to quit. What are the benefits of quitting smoking? When you quit smoking, you lower your risk for getting serious diseases and conditions. They can include:  Lung cancer or lung disease.  Heart disease.  Stroke.  Heart attack.  Not being able to have children (infertility).  Weak bones (osteoporosis) and broken bones (fractures). If you have coughing, wheezing, and shortness of breath, those symptoms may get better when you quit. You may also get sick less often. If you are pregnant, quitting smoking can help to lower your chances of having a baby of low birth weight. What can I do to help me quit smoking? Talk with your doctor about what can help you quit smoking. Some things you can do (strategies) include:  Quitting smoking totally, instead of slowly cutting back how much you smoke over a period of time.  Going to in-person counseling. You are more likely to quit if you go to many counseling sessions.  Using resources and support systems, such as: ? Agricultural engineer with a Veterinary surgeon. ? Phone quitlines. ? Automotive engineer. ? Support groups  or group counseling. ? Text messaging programs. ? Mobile phone apps or applications.  Taking medicines. Some of these medicines may have nicotine in them. If you are pregnant or breastfeeding, do not take any medicines to quit smoking unless your doctor says it is okay. Talk with your doctor about counseling or other things that can help you. Talk with your doctor about using more than one strategy at the same time, such as taking medicines while you are also going to in-person counseling. This can help make quitting easier. What things can I do to make it easier to quit? Quitting smoking might feel very hard at first, but there is a lot that you can do to make it easier. Take these steps:  Talk to your family and friends. Ask them to support and encourage you.  Call phone quitlines, reach out to support groups, or work with a Veterinary surgeon.  Ask people who smoke to not smoke around you.  Avoid places that make you want (trigger) to smoke, such as: ? Bars. ? Parties. ? Smoke-break areas at work.  Spend time with people who do not smoke.  Lower the stress in your life. Stress can make you want to smoke. Try these things to help your stress: ? Getting regular exercise. ? Deep-breathing exercises. ? Yoga. ? Meditating. ? Doing a body scan. To do this, close your eyes, focus on one area of your body at  a time from head to toe, and notice which parts of your body are tense. Try to relax the muscles in those areas.  Download or buy apps on your mobile phone or tablet that can help you stick to your quit plan. There are many free apps, such as QuitGuide from the [employer] developer for Disease Control and Prevention). You can find more support from smokefree.gov and other websites. This information is not intended to replace advice given to you by your health care provider. Make sure you discuss any questions you have with your health care provider. Document Released: 03/20/2009 Document Revised:  01/20/2016 Document Reviewed: 10/08/2014 Elsevier Interactive Patient Education  2019 Elsevier Inc.    DASH Eating Plan DASH stands for "Dietary Approaches to Stop Hypertension." The DASH eating plan is a healthy eating plan that has been shown to reduce high blood pressure (hypertension). It may also reduce your risk for type 2 diabetes, heart disease, and stroke. The DASH eating plan may also help with weight loss. What are tips for following this plan?  General guidelines  Avoid eating more than 2,300 mg (milligrams) of salt (sodium) a day. If you have hypertension, you may need to reduce your sodium intake to 1,500 mg a day.  Limit alcohol intake to no more than 1 drink a day for nonpregnant women and 2 drinks a day for men. One drink equals 12 oz of beer, 5 oz of wine, or 1 oz of hard liquor.  Work with your health care provider to maintain a healthy body weight or to lose weight. Ask what an ideal weight is for you.  Get at least 30 minutes of exercise that causes your heart to beat faster (aerobic exercise) most days of the week. Activities may include walking, swimming, or biking.  Work with your health care provider or diet and nutrition specialist (dietitian) to adjust your eating plan to your individual calorie needs. Reading food labels   Check food labels for the amount of sodium per serving. Choose foods with less than 5 percent of the Daily Value of sodium. Generally, foods with less than 300 mg of sodium per serving fit into this eating plan.  To find whole grains, look for the word "whole" as the first word in the ingredient list. Shopping  Buy products labeled as "low-sodium" or "no salt added."  Buy fresh foods. Avoid canned foods and premade or frozen meals. Cooking  Avoid adding salt when cooking. Use salt-free seasonings or herbs instead of table salt or sea salt. Check with your health care provider or pharmacist before using salt substitutes.  Do not fry  foods. Cook foods using healthy methods such as baking, boiling, grilling, and broiling instead.  Cook with heart-healthy oils, such as olive, canola, soybean, or sunflower oil. Meal planning  Eat a balanced diet that includes: ? 5 or more servings of fruits and vegetables each day. At each meal, try to fill half of your plate with fruits and vegetables. ? Up to 6-8 servings of whole grains each day. ? Less than 6 oz of lean meat, poultry, or fish each day. A 3-oz serving of meat is about the same size as a deck of cards. One egg equals 1 oz. ? 2 servings of low-fat dairy each day. ? A serving of nuts, seeds, or beans 5 times each week. ? Heart-healthy fats. Healthy fats called Omega-3 fatty acids are found in foods such as flaxseeds and coldwater fish, like sardines, salmon, and mackerel.  Limit how much you eat of the following: ? Canned or prepackaged foods. ? Food that is high in trans fat, such as fried foods. ? Food that is high in saturated fat, such as fatty meat. ? Sweets, desserts, sugary drinks, and other foods with added sugar. ? Full-fat dairy products.  Do not salt foods before eating.  Try to eat at least 2 vegetarian meals each week.  Eat more home-cooked food and less restaurant, buffet, and fast food.  When eating at a restaurant, ask that your food be prepared with less salt or no salt, if possible. What foods are recommended? The items listed may not be a complete list. Talk with your dietitian about what dietary choices are best for you. Grains Whole-grain or whole-wheat bread. Whole-grain or whole-wheat pasta. Brown rice. [place]. Bulgur. Whole-grain and low-sodium cereals. Pita bread. Low-fat, low-sodium crackers. Whole-wheat flour tortillas. Vegetables Fresh or frozen vegetables (raw, steamed, roasted, or grilled). Low-sodium or reduced-sodium tomato and vegetable juice. Low-sodium or reduced-sodium tomato sauce and tomato paste. Low-sodium or  reduced-sodium canned vegetables. Fruits All fresh, dried, or frozen fruit. Canned fruit in natural juice (without added sugar). Meat and other protein foods Skinless chicken or [place]. Ground chicken or [place]. Pork with fat trimmed off. Fish and seafood. Egg whites. Dried beans, peas, or lentils. Unsalted nuts, nut butters, and seeds. Unsalted canned beans. Lean cuts of beef with fat trimmed off. Low-sodium, lean deli meat. Dairy Low-fat (1%) or fat-free (skim) milk. Fat-free, low-fat, or reduced-fat cheeses. Nonfat, low-sodium ricotta or cottage cheese. Low-fat or nonfat yogurt. Low-fat, low-sodium cheese. Fats and oils Soft margarine without trans fats. Vegetable oil. Low-fat, reduced-fat, or light mayonnaise and salad dressings (reduced-sodium). Canola, safflower, olive, soybean, and sunflower oils. Avocado. Seasoning and other foods Herbs. Spices. Seasoning mixes without salt. Unsalted popcorn and pretzels. Fat-free sweets. What foods are not recommended? The items listed may not be a complete list. Talk with your dietitian about what dietary choices are best for you. Grains Baked goods made with fat, such as croissants, muffins, or some breads. Dry pasta or rice meal packs. Vegetables Creamed or fried vegetables. Vegetables in a cheese sauce. Regular canned vegetables (not low-sodium or reduced-sodium). Regular canned tomato sauce and paste (not low-sodium or reduced-sodium). Regular tomato and vegetable juice (not low-sodium or reduced-sodium). Angie Fava. Olives. Fruits Canned fruit in a light or heavy syrup. Fried fruit. Fruit in cream or butter sauce. Meat and other protein foods Fatty cuts of meat. Ribs. Fried meat. Berniece Salines. Sausage. Bologna and other processed lunch meats. Salami. Fatback. Hotdogs. Bratwurst. Salted nuts and seeds. Canned beans with added salt. Canned or smoked fish. Whole eggs or egg yolks. Chicken or [place] with skin. Dairy Whole or 2% milk, cream, and half-and-half.  Whole or full-fat cream cheese. Whole-fat or sweetened yogurt. Full-fat cheese. Nondairy creamers. Whipped toppings. Processed cheese and cheese spreads. Fats and oils Butter. Stick margarine. Lard. Shortening. Ghee. Bacon fat. Tropical oils, such as coconut, palm kernel, or palm oil. Seasoning and other foods Salted popcorn and pretzels. Onion salt, garlic salt, seasoned salt, table salt, and sea salt. Worcestershire sauce. Tartar sauce. Barbecue sauce. Teriyaki sauce. Soy sauce, including reduced-sodium. Steak sauce. Canned and packaged gravies. Fish sauce. Oyster sauce. Cocktail sauce. Horseradish that you find on the shelf. Ketchup. Mustard. Meat flavorings and tenderizers. Bouillon cubes. Hot sauce and Tabasco sauce. Premade or packaged marinades. Premade or packaged taco seasonings. Relishes. Regular salad dressings. Where to find more information:  National Heart, Lung, and Blood  Institute: PopSteam.is  American Heart Association: www.heart.org Summary  The DASH eating plan is a healthy eating plan that has been shown to reduce high blood pressure (hypertension). It may also reduce your risk for type 2 diabetes, heart disease, and stroke.  With the DASH eating plan, you should limit salt (sodium) intake to 2,300 mg a day. If you have hypertension, you may need to reduce your sodium intake to 1,500 mg a day.  When on the DASH eating plan, aim to eat more fresh fruits and vegetables, whole grains, lean proteins, low-fat dairy, and heart-healthy fats.  Work with your health care provider or diet and nutrition specialist (dietitian) to adjust your eating plan to your individual calorie needs. This information is not intended to replace advice given to you by your health care provider. Make sure you discuss any questions you have with your health care provider. Document Released: 05/13/2011 Document Revised: 05/17/2016 Document Reviewed: 05/17/2016 Elsevier Interactive Patient Education   2019 [employer].

## 2018-08-07 NOTE — Progress Notes (Signed)
Zachary Campos, is a 53 y.o. male  HBZ:[id number]  BOF:[id number]  DOB - 12/04/1965  CC:  Chief Complaint  Patient presents with  . Establish Care  . Hypertension  . Hyperlipidemia       HPI: Zachary Campos is a 53 y.o. male is here today to establish care.   Zachary Campos has Epistaxis; Tobacco abuse; Hyperlipidemia; Essential hypertension; Near syncope; AKI (acute kidney injury) (HCC); Hyponatremia; and Hypotension on their problem list.   Hospital follow-up Recently admitted, 07/06/18 for a near syncopal event after starting lisinopril-HCTZ . On arrival to ER BP was 93/72 and heart rate 102. Renal function was abnormal with BUN 35 and creatinine 2.2. He was admitted for overnight observation. Lisinopril- HCTZ discontinued. He was discharged on Amlodipine 2.5 mg. He reports today BP readings at home have been rather elevated >140/90<160/90. He has not experienced any further nose bleeds or near syncope episodes.  He denies dizziness.  He had orthostatics performed in the hospital all which were negative.  He denies any chest pain or shortness of breath. He admits to daily heavy alcohol use. He endorses that he drinks approximately a 12 pack of beer per day.  He is actively trying to cut back. He is also a tobacco user and smokes daily.  He endorses that he also like to quit at some point.  Patient denies new headaches, chest pain, abdominal pain, nausea, new weakness , numbness or tingling, SOB, edema, or worrisome cough.   Current medications: Current Outpatient Medications:  .  amLODipine (NORVASC) 2.5 MG tablet, Take 1 tablet (2.5 mg total) by mouth daily., Disp: 30 tablet, Rfl: 0 .  atorvastatin (LIPITOR) 20 MG tablet, Take 1 tablet (20 mg total) by mouth daily., Disp: 30 tablet, Rfl: 1   Pertinent family medical history: family history includes Bladder Cancer in his mother; Hyperlipidemia in his mother; Hypertension in his mother.   No Known Allergies  Social History   Socioeconomic  History  . Marital status: Married    Spouse name: Not on file  . Number of children: 1  . Years of education: Not on file  . Highest education level: Not on file  Occupational History  . Occupation: Office manager  . Financial resource strain: Not on file  . Food insecurity:    Worry: Not on file    Inability: Not on file  . Transportation needs:    Medical: Not on file    Non-medical: Not on file  Tobacco Use  . Smoking status: Current Every Day Smoker    Packs/day: 0.50    Types: Cigarettes  . Smokeless tobacco: Never Used  Substance and Sexual Activity  . Alcohol use: Yes    Comment: averages 1 40oz beer every evening  . Drug use: No  . Sexual activity: Not on file  Lifestyle  . Physical activity:    Days per week: Not on file    Minutes per session: Not on file  . Stress: Not on file  Relationships  . Social connections:    Talks on phone: Not on file    Gets together: Not on file    Attends religious service: Not on file    Active member of club or organization: Not on file    Attends meetings of clubs or organizations: Not on file    Relationship status: Not on file  . Intimate partner violence:    Fear of current or ex partner: Not on file    Emotionally  abused: Not on file    Physically abused: Not on file    Forced sexual activity: Not on file  Other Topics Concern  . Not on file  Social History Narrative   Works at a [employer], commissioning.    Review of Systems: Pertinent negatives listed in HPI Objective:  There were no vitals filed for this visit.  BP Readings from Last 3 Encounters:  07/07/18 (!) 154/96  06/27/18 (!) 165/112  02/28/17 (!) 139/98    Filed Weights   08/07/18 1345  Weight: 182 lb (82.6 kg)      Physical Exam: Constitutional: Patient appears well-developed and well-nourished. No distress. HENT: Normocephalic, atraumatic, External right and left ear normal.  Eyes: Conjunctivae and EOM are normal.  PERRLA, no scleral icterus. Neck: Normal ROM. Neck supple. No JVD. No tracheal deviation. No thyromegaly. CVS: RRR, S1/S2 +, no murmurs, no gallops, no carotid bruit.  Pulmonary: Effort and breath sounds normal, no stridor, rhonchi, wheezes, rales.  Abdominal: Soft. BS +, no distension, tenderness, rebound or guarding.  Musculoskeletal: Normal range of motion. No edema and no tenderness.  Neuro: Alert. Normal muscle tone coordination. Normal gait. BUE and BLE strength 5/5.  Skin: Skin is warm and dry. No rash noted. Not diaphoretic. No erythema. No pallor. Psychiatric: Normal mood and affect. Behavior, judgment, thought content normal. Lab Results (prior encounters)  Lab Results  Component Value Date   WBC 7.3 07/07/2018   HGB 11.9 (L) 07/07/2018   HCT 37.3 (L) 07/07/2018   MCV 91.0 07/07/2018   PLT 138 (L) 07/07/2018   Lab Results  Component Value Date   CREATININE 1.15 07/07/2018   BUN 23 (H) 07/07/2018   NA 135 07/07/2018   K 4.0 07/07/2018   CL 103 07/07/2018   CO2 24 07/07/2018    Lab Results  Component Value Date   HGBA1C 6.7 (H) 08/07/2018       Component Value Date/Time   CHOL 216 (H) 07/06/2018 0755   TRIG 199 (H) 07/06/2018 0755   HDL 48 07/06/2018 0755   CHOLHDL 4.5 07/06/2018 0755   VLDL 40 07/06/2018 0755   LDLCALC 128 (H) 07/06/2018 0755        Assessment and plan:  1. Encounter to establish care  2. Essential hypertension -Elevated readings at home since starting amlodipine 2.5 mg  Increased dose amlodipine 5 mg  Adding Carvedilol 3.125 mg given heart rate   3. Elevated glucose during recent hospitalization Checking  Hemoglobin A1c Start metformin if warranted  4. AKI (acute kidney injury) (HCC) Recheck the following:  - Comprehensive metabolic panel - CBC with Differential  5. Tobacco use disorder Encouraged cessation  6. Hyperlipidemia, unspecified hyperlipidemia type -Patient agreed to resume Lipitor and take with food at dinner to  reduce GI symptoms  Will change medication if GI symptoms persist.  7. Tachycardia -Adding Carvedilol 3.125 mg given heart rate  - Thyroid Panel With TSH - EKG 12-Lead, sinus tachycardia   Return for 08/11/18- BP check nurse and 6 weeks provider .   The patient was given clear instructions to go to ER or return to medical center if symptoms don't improve, worsen or new problems develop. The patient verbalized understanding. The patient was advised  to call and obtain lab results if they haven't heard anything from out office within 7-10 business days.  Joaquin Courts, FNP Primary Care at [facility] [address] [place] [address] [phone]: [phone]    This note has been created with Reubin Milan  Geophysicist/field seismologist. Any transcriptional errors are unintentional.

## 2018-08-08 ENCOUNTER — Telehealth: Payer: Self-pay | Admitting: Family Medicine

## 2018-08-08 ENCOUNTER — Other Ambulatory Visit: Payer: Self-pay | Admitting: Family Medicine

## 2018-08-08 LAB — CBC WITH DIFFERENTIAL/PLATELET
Basophils Absolute: 0.1 10*3/uL (ref 0.0–0.2)
Basos: 1 %
EOS (ABSOLUTE): 0.1 10*3/uL (ref 0.0–0.4)
Eos: 1 %
Hematocrit: 40.5 % (ref 37.5–51.0)
Hemoglobin: 14 g/dL (ref 13.0–17.7)
Immature Grans (Abs): 0 10*3/uL (ref 0.0–0.1)
Immature Granulocytes: 0 %
LYMPHS ABS: 1.5 10*3/uL (ref 0.7–3.1)
Lymphs: 22 %
MCH: 29.7 pg (ref 26.6–33.0)
MCHC: 34.6 g/dL (ref 31.5–35.7)
MCV: 86 fL (ref 79–97)
Monocytes Absolute: 0.9 10*3/uL (ref 0.1–0.9)
Monocytes: 13 %
NEUTROS ABS: 4.3 10*3/uL (ref 1.4–7.0)
Neutrophils: 63 %
Platelets: 135 10*3/uL — ABNORMAL LOW (ref 150–450)
RBC: 4.72 x10E6/uL (ref 4.14–5.80)
RDW: 16.1 % — ABNORMAL HIGH (ref 11.6–15.4)
WBC: 6.8 10*3/uL (ref 3.4–10.8)

## 2018-08-08 LAB — COMPREHENSIVE METABOLIC PANEL
ALT: 62 IU/L — ABNORMAL HIGH (ref 0–44)
AST: 70 IU/L — ABNORMAL HIGH (ref 0–40)
Albumin/Globulin Ratio: 1.8 (ref 1.2–2.2)
Albumin: 4.6 g/dL (ref 3.8–4.9)
Alkaline Phosphatase: 52 IU/L (ref 39–117)
BILIRUBIN TOTAL: 0.4 mg/dL (ref 0.0–1.2)
BUN/Creatinine Ratio: 14 (ref 9–20)
BUN: 13 mg/dL (ref 6–24)
CHLORIDE: 102 mmol/L (ref 96–106)
CO2: 22 mmol/L (ref 20–29)
Calcium: 9.8 mg/dL (ref 8.7–10.2)
Creatinine, Ser: 0.96 mg/dL (ref 0.76–1.27)
GFR calc non Af Amer: 89 mL/min/{1.73_m2} (ref >59)
GFR, EST AFRICAN AMERICAN: 103 mL/min/{1.73_m2} (ref >59)
GLUCOSE: 174 mg/dL — AB (ref 65–99)
Globulin, Total: 2.6 g/dL (ref 1.5–4.5)
Potassium: 4.5 mmol/L (ref 3.5–5.2)
Sodium: 141 mmol/L (ref 134–144)
Total Protein: 7.2 g/dL (ref 6.0–8.5)

## 2018-08-08 LAB — THYROID PANEL WITH TSH
Free Thyroxine Index: 1.4 (ref 1.2–4.9)
T3 UPTAKE RATIO: 28 % (ref 24–39)
T4, Total: 5.1 ug/dL (ref 4.5–12.0)
TSH: 0.84 u[IU]/mL (ref 0.450–4.500)

## 2018-08-08 LAB — HEMOGLOBIN A1C
Est. average glucose Bld gHb Est-mCnc: 146 mg/dL
Hgb A1c MFr Bld: 6.7 % — ABNORMAL HIGH (ref 4.8–5.6)

## 2018-08-08 MED ORDER — ATORVASTATIN CALCIUM 20 MG PO TABS: 20.0000 mg | ORAL_TABLET | Freq: Every day | ORAL | 1 refills | Status: DC

## 2018-08-08 NOTE — Telephone Encounter (Signed)
Paperwork received on @TODAY @   Type of paperwork: [employer]  Route received: Patient left at office   Has patient completed their portion of the paperwork: Yes  Has office staff updated demographics of paperwork: Yes  Paperwork routed to provider :   Yes

## 2018-08-08 NOTE — Telephone Encounter (Signed)
Recent labs were significant for elevations in liver enzymes this is likely secondary to alcohol consumption.  I would like to repeat at your follow-up visit. If liver enzymes remain elevated will need to perform a ultrasound Screening for diabetes was significant for type 2 diabetes with an A1c of 6.7.  I would recommend twice daily metformin 500 mg for management of type 2 diabetes.  A1c should remain less than 7 for diabetes to be very well controlled. It is imperative that he continues to take his statin therapy as prescribed.  Given the new diagnosis of type 2 diabetes along with hypertension this increases his risk of a cardiovascular event such as a stroke or heart attack if these processes not well controlled. His platelet count is also low however this is unchanged from recent findings at hospital.

## 2018-08-10 MED ORDER — METFORMIN HCL 500 MG PO TABS: 500.0000 mg | ORAL_TABLET | Freq: Two times a day (BID) | ORAL | 3 refills | Status: DC

## 2018-08-10 NOTE — Telephone Encounter (Signed)
Patient notified of lab results & recommendations. Expressed understanding. Is agreeable to starting Metformin. Rx sent to pharmacy.

## 2018-08-11 ENCOUNTER — Encounter: Payer: Self-pay | Admitting: Family Medicine

## 2018-08-11 ENCOUNTER — Ambulatory Visit (INDEPENDENT_AMBULATORY_CARE_PROVIDER_SITE_OTHER): Payer: BC Managed Care – PPO

## 2018-08-11 VITALS — BP 135/86 | HR 79

## 2018-08-11 DIAGNOSIS — I10 Essential (primary) hypertension: Secondary | ICD-10-CM

## 2018-08-11 DIAGNOSIS — Z0130 Encounter for examination of blood pressure without abnormal findings: Secondary | ICD-10-CM

## 2018-08-11 NOTE — Progress Notes (Signed)
Patient here for BP check. BP 135/86, pulse 79. Spoke with provider & she advises to continue current medication and keep scheduled follow up on 09/19/2018.  KWalker, CMA.

## 2018-08-14 NOTE — Telephone Encounter (Signed)
FMLA paperwork completed. Please fax, scan to EMR, and return document with fax confirmation sheet back to provider.

## 2018-08-15 ENCOUNTER — Telehealth: Payer: Self-pay | Admitting: Family Medicine

## 2018-08-15 NOTE — Telephone Encounter (Signed)
Tried faxing several times, kept receiving communication error. Called and requested e-mail, scanned to [email or web address].

## 2018-09-19 ENCOUNTER — Ambulatory Visit: Payer: BC Managed Care – PPO | Admitting: Family Medicine

## 2018-09-25 ENCOUNTER — Ambulatory Visit: Payer: BC Managed Care – PPO | Admitting: Family Medicine

## 2018-09-25 NOTE — Progress Notes (Deleted)
Virtual Visit via Telephone Note  I connected with Zachary Campos on 09/25/18 at  2:10 PM EDT by telephone and verified that I am speaking with the correct person using two identifiers.   I discussed the limitations, risks, security and privacy concerns of performing an evaluation and management service by telephone and the availability of in person appointments. I also discussed with the patient that there may be a patient responsible charge related to this service. The patient expressed understanding and agreed to proceed.  Provider is located at primary care office for today's encounter.   History of Present Illness: Hypertension follow-up  Zachary Campos is monitoring his blood pressure at home. He was hospitalized due to hypotension back January and blood pressure medications were stopped. He was seen here in office back on 08/07/18 and brought home BP readings which indicated blood pressure readings were trending up above goal. He was was placed on Amlodipine 2.5 mg and already take Carvedilol 3.25 mg . He returned to the office for blood pressure check on 08/11/18 and BP was well controlled and stable. Today he reports  Reports adherence to blood pressure medications. Reports efforts to adhere to low sodium diet. *He/She is a nonsmoker. Denies any episodes of dizziness, headaches, shortness of breath, or chest pain.    Assessment and Plan:   Follow Up Instructions:    I discussed the assessment and treatment plan with the patient. The patient was provided an opportunity to ask questions and all were answered. The patient agreed with the plan and demonstrated an understanding of the instructions.   The patient was advised to call back or seek an in-person evaluation if the symptoms worsen or if the condition fails to improve as anticipated.  I provided 15 minutes of non-face-to-face time during this encounter.   Joaquin Courts, FNP

## 2018-09-27 ENCOUNTER — Encounter: Payer: Self-pay | Admitting: Family Medicine

## 2018-09-27 ENCOUNTER — Other Ambulatory Visit: Payer: Self-pay

## 2018-09-27 ENCOUNTER — Ambulatory Visit (INDEPENDENT_AMBULATORY_CARE_PROVIDER_SITE_OTHER): Payer: BC Managed Care – PPO | Admitting: Family Medicine

## 2018-09-27 DIAGNOSIS — J302 Other seasonal allergic rhinitis: Secondary | ICD-10-CM

## 2018-09-27 DIAGNOSIS — E119 Type 2 diabetes mellitus without complications: Secondary | ICD-10-CM

## 2018-09-27 DIAGNOSIS — Z720 Tobacco use: Secondary | ICD-10-CM

## 2018-09-27 DIAGNOSIS — I10 Essential (primary) hypertension: Secondary | ICD-10-CM | POA: Diagnosis not present

## 2018-09-27 DIAGNOSIS — R42 Dizziness and giddiness: Secondary | ICD-10-CM | POA: Diagnosis not present

## 2018-09-27 MED ORDER — IPRATROPIUM BROMIDE 0.03 % NA SOLN: 2.0000 | Freq: Two times a day (BID) | NASAL | 0 refills | Status: DC

## 2018-09-27 NOTE — Progress Notes (Deleted)
Intermittent dizziness when standing Takes medications daily   Runny nose and occasional cough and itchy throat  Does not have a glucometer, unable to take blood sugars   Letter- to be faxed to employer because he is high risk.  Dates back to 09/06/2018

## 2018-09-27 NOTE — Progress Notes (Signed)
Virtual Visit via Telephone Note  I connected with Zachary Campos on 09/27/18 at  8:30 AM EDT by telephone and verified that I am speaking with the correct person using two identifiers.   I discussed the limitations, risks, security and privacy concerns of performing an evaluation and management service by telephone and the availability of in person appointments. I also discussed with the patient that there may be a patient responsible charge related to this service. The patient expressed understanding and agreed to proceed.  Provider located at primary care office during today's encounter.   History of Present Illness:  Hypertension Zachary Campos reports home monitoring of blood pressure, however, not on a daily consistent basis. Blood pressure measured during encounter by significant other. Monitor initially read as an error and repeat BP 225/136. Patient last measured BP himself 2 days prior and reading 156/80 and last week, he reports mostly all readings averaged 140/80's. His last BP check here in office on 08/11/18, was stable at 135/86. He denies headaches, edema, chest pain, or shortness of breath.  He is taking medication as prescribed daily. Reports that he has made changes to his eating such as reduction of high fat and high sodium foods. He has also increased vegetables.  Dizziness Zachary Campos has experienced dizziness since his last admission to the hospital for hypotension. He reports symptoms of dizziness has greatly improved and on occur when he gets out of bed in morning and or stands up from sitting for an extended period of time. Episodes of dizziness lasts only seconds. Denies any associated visual changes, weakness, or headaches.   Seasonal allergies  Zachary Campos suffers from seasonal allergies mostly occurring in the spring months. He has not taken anything this season as he was unaware of what he could take safely with current medications in which he is prescribed.  Complains of runny nose and  congestion. No fever, cough, or shortness of breath.   Zachary Campos also is requesting a letter to return to work without restrictions. He also would like for the letter to indicate that he falls into the high risk category for COVID.  Assessment and Plan: 1. Essential hypertension Per home readings blood pressure not at goal of less than 140/90 in recent diagnosis of diabetes blood pressure should be less than 130/90.  No medication adjustments made today. Verbally instructed on the proper fit to measure blood pressure and the importance of taking blood pressure at the same time on a daily consistent basis. Patient will continue amlodipine 2.5 mg and carvedilol 3.125 mg twice daily.  Will refrain from making any medication adjustments until patient is seen in the office at a follow-up appointment.  2. Type 2 diabetes mellitus without complication, without long-term current use of insulin (HCC) -New diagnosis Only on metformin 500 mg twice daily, continue current dose. Repeat an A1c at follow-up appointment.  3. Tobacco abuse Encourage cessation  4. Seasonal allergies -Recommended OTC Zyrtec and or Loratadine -Prescribed Atrovent nasal spray, 2 sprays up to 2-3 times daily as needed.   Work letter completed indicating patient is high risk for COVID-19 however, wishes to return to work without restrictions.   Follow Up Instructions: Return for care 6 to 8 weeks hypertension management and repeat A1c   I discussed the assessment and treatment plan with the patient. The patient was provided an opportunity to ask questions and all were answered. The patient agreed with the plan and demonstrated an understanding of the instructions.   The patient was advised to  call back or seek an in-person evaluation if the symptoms worsen or if the condition fails to improve as anticipated.  I provided 20 minutes of non-face-to-face time during this encounter.   Joaquin Courts, FNP

## 2018-10-04 ENCOUNTER — Telehealth: Payer: Self-pay | Admitting: Family Medicine

## 2018-10-04 NOTE — Telephone Encounter (Signed)
Patient called requesting a letter stating that he could be out for extended leave since he is high risk for covid 19, please follow up.  Fax to [phone]

## 2018-10-04 NOTE — Telephone Encounter (Addendum)
This request was completed on 09/27/18. Please notify patient and letter is on [facility]

## 2018-10-04 NOTE — Telephone Encounter (Signed)
Called patient. He states that he needed a letter that is more specific about him being out of work until his job opens back up due to COVID restrictions & him being high risk. Let patient know that the letter states that he is cleared to return to work once schools open back up.

## 2018-10-31 ENCOUNTER — Telehealth: Payer: Self-pay | Admitting: Family Medicine

## 2018-10-31 NOTE — Telephone Encounter (Signed)
Paperwork received on @TODAY @   Type of paperwork:  Route received:  Fax   Has patient completed their portion of the paperwork:   Has office staff updated demographics of paperwork: yes  Paperwork routed to provider :   Placed In provider box

## 2018-11-01 NOTE — Telephone Encounter (Signed)
error 

## 2018-11-06 ENCOUNTER — Telehealth: Payer: Self-pay

## 2018-11-06 NOTE — Telephone Encounter (Signed)
Called patient to do their pre-visit COVID screening.  Call went to voicemail. Unable to do screening.  

## 2018-11-07 ENCOUNTER — Other Ambulatory Visit: Payer: Self-pay

## 2018-11-07 ENCOUNTER — Encounter: Payer: Self-pay | Admitting: Family Medicine

## 2018-11-07 ENCOUNTER — Encounter (INDEPENDENT_AMBULATORY_CARE_PROVIDER_SITE_OTHER): Payer: Self-pay

## 2018-11-07 ENCOUNTER — Ambulatory Visit (INDEPENDENT_AMBULATORY_CARE_PROVIDER_SITE_OTHER): Payer: BC Managed Care – PPO | Admitting: Family Medicine

## 2018-11-07 VITALS — BP 187/120 | HR 83 | Temp 98.0°F | Ht 72.0 in | Wt 197.2 lb

## 2018-11-07 DIAGNOSIS — Z23 Encounter for immunization: Secondary | ICD-10-CM | POA: Diagnosis not present

## 2018-11-07 DIAGNOSIS — E119 Type 2 diabetes mellitus without complications: Secondary | ICD-10-CM

## 2018-11-07 DIAGNOSIS — E785 Hyperlipidemia, unspecified: Secondary | ICD-10-CM | POA: Diagnosis not present

## 2018-11-07 DIAGNOSIS — I10 Essential (primary) hypertension: Secondary | ICD-10-CM | POA: Diagnosis not present

## 2018-11-07 DIAGNOSIS — Z1159 Encounter for screening for other viral diseases: Secondary | ICD-10-CM | POA: Diagnosis not present

## 2018-11-07 MED ORDER — CARVEDILOL 3.125 MG PO TABS: 3.1250 mg | ORAL_TABLET | Freq: Two times a day (BID) | ORAL | 2 refills | Status: DC

## 2018-11-07 MED ORDER — BLOOD GLUCOSE METER KIT: PACK | 0 refills | Status: DC

## 2018-11-07 MED ORDER — METFORMIN HCL 500 MG PO TABS: 500.0000 mg | ORAL_TABLET | Freq: Two times a day (BID) | ORAL | 3 refills | Status: DC

## 2018-11-07 MED ORDER — ATORVASTATIN CALCIUM 20 MG PO TABS: 20.0000 mg | ORAL_TABLET | Freq: Every day | ORAL | 3 refills | Status: DC

## 2018-11-07 MED ORDER — AMLODIPINE BESYLATE 5 MG PO TABS: 5.0000 mg | ORAL_TABLET | Freq: Every day | ORAL | 3 refills | Status: DC

## 2018-11-07 NOTE — Progress Notes (Deleted)
Per pt he is here today to talk about his DM/HTN and labs

## 2018-11-07 NOTE — Progress Notes (Signed)
Patient ID: Zachary Campos, male    DOB: [birth year], 53 y.o.   MRN: [id number]  PCP: Bing NeighborsHarris, Lyndi Holbein S, FNP  Chief Complaint  Patient presents with  . Diabetes  . Hypertension    Subjective:  HPI Zachary Campos is a 53 y.o. male presents for evaluation hypertension, diabetes, and hyperlipidemia.  Zachary Campos has Epistaxis; Tobacco abuse; Hyperlipidemia; Essential hypertension; Near syncope; AKI (acute kidney injury) (HCC); Hyponatremia; and Hypotension on their problem list.   Hypertension  Currently does not check his blood pressure at home.  He endorses occasional left-sided headaches.  He is extremely hypertensive today with a blood pressure reading of 187/120 and 196/116.  Patient's current blood pressure medicines include amlodipine 2.5 mg once daily and carvedilol 3.125.  He reports that he took his medication approximately 45 minutes ago and has been consistently taking medication as prescribed.  He also endorses smoking and drinking coffee prior to his arrival today.  He endorses increased cigarette use over the last week and has been smoking at least a half a pack per day.  He denies chest pain or shortness of breath.  He endorses experiencing intermittent nosebleeds.  He is able to stop the nosebleeds.  He has a history of nosebleeds dating back to 2013.  Diabetes  Patient is a newly diagnosed diabetic Last A1c 6.6.  He was diagnosed in March after performing routine lab work.  Patient is asymptomatic of polyuria, polydipsia, polyphagia.  He denies any neuro pathic pain.  He does not check his blood sugar at home as he does not have a meter.  He is open to having a meter if he is taught how to check his blood sugar.  He is compliant with metformin. Recently changed diet to reduce intake of red meat and now mostly eats Mal[place]urkey and chicken. He is active although not physically active of routine daily exercise. Social History   Socioeconomic History  . Marital status: Married   Spouse name: Not on file  . Number of children: 1  . Years of education: Not on file  . Highest education level: Not on file  Occupational History  . 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 Financial resource strain: Not on file  . Food insecurity:    Worry: Not on file    Inability: Not on file  . Transportation needs:    Medical: Not on file    Non-medical: Not on file  Tobacco Use  . Smoking status: Current Every Day Smoker    Packs/day: 0.50    Types: Cigarettes  . Smokeless tobacco: Never Used  Substance and Sexual Activity  . Alcohol use: Yes    Comment: averages 1 40oz beer every evening  . Drug use: No  . Sexual activity: Not on file  Lifestyle  . Physical activity:    Days per week: Not on file    Minutes per session: Not on file  . Stress: Not on file  Relationships  . Social connections:    Talks on phone: Not on file    Gets together: Not on file    Attends religious service: Not on file    Active member of club or organization: Not on file    Attends meetings of clubs or organizations: Not on file    Relationship status: Not on file  . Intimate partner violence:    Fear of current or ex partner: Not on file    Emotionally abused: Not on file  Physically abused: Not on file    Forced sexual activity: Not on file  Other Topics Concern  . Not on file  Social History Narrative   Works at a [employer].    Family History  Problem Relation Age of Onset  . Bladder Cancer Mother   . Hypertension Mother   . Hyperlipidemia Mother   . Heart disease Neg Hx   . Stroke Neg Hx   . Diabetes Neg Hx     Review of Systems Pertinent negatives listed in HPI No Known Allergies  Prior to Admission medications   Medication Sig Start Date End Date Taking? Authorizing Provider  amLODipine (NORVASC) 2.5 MG tablet Take 1 tablet (2.5 mg total) by mouth daily. 07/08/18  Yes Kathlen Mody, MD  atorvastatin (LIPITOR) 20 MG tablet Take 1 tablet (20  mg total) by mouth daily at 6 PM. 08/08/18  Yes Bing Neighbors, FNP  carvedilol (COREG) 3.125 MG tablet Take 1 tablet (3.125 mg total) by mouth 2 (two) times daily with a meal. 08/07/18  Yes Bing Neighbors, FNP  ipratropium (ATROVENT) 0.03 % nasal spray Place 2 sprays into both nostrils 2 (two) times daily. 09/27/18  Yes Bing Neighbors, FNP  metFORMIN (GLUCOPHAGE) 500 MG tablet Take 1 tablet (500 mg total) by mouth 2 (two) times daily with a meal. 08/10/18  Yes Bing Neighbors, FNP    Past Medical, Surgical Family and Social History reviewed and updated.    Objective:   Today's Vitals   11/07/18 0847 11/07/18 0848  BP: (!) 213/112 (!) 187/120  Pulse: 83   Temp: 98 F (36.7 C)   TempSrc: Oral   SpO2: 95%   Weight: 197 lb 3.2 oz (89.4 kg)   Height: 6' (1.829 m)   PainSc: 0-No pain     BP Readings from Last 3 Encounters:  11/07/18 (!) 187/120  08/11/18 135/86  08/07/18 (!) 144/79    Filed Weights   11/07/18 0847  Weight: 197 lb 3.2 oz (89.4 kg)       Physical Exam General appearance: alert, well developed, well nourished, cooperative and in no distress Head: Normocephalic, without obvious abnormality, atraumatic Respiratory: Respirations even and unlabored, normal respiratory rate Heart: rate and rhythm normal. No gallop or murmurs noted on exam  Abdomen: BS +, no distention, no rebound tenderness, or no mass Extremities: No gross deformities Skin: Skin color, texture, turgor normal. No rashes seen  Psych: Appropriate mood and affect. Neurologic: Mental status: Alert, oriented to person, place, and time, thought content appropriate.  No results found for: POCGLU  Lab Results  Component Value Date   HGBA1C 6.7 (H) 08/07/2018   Assessment & Plan:  1. Type 2 diabetes mellitus without complication, without long-term current use of insulin (HCC) last A1c 6.7.  Patient has remained compliant with current medication regimen.  Patient provided a glucometer today  along with demonstration of use.  Patient advised to check as needed or once daily. - Microalbumin, urine - Hemoglobin A1c - Comprehensive metabolic panel  2. Accelerated hypertension Increased Amlodipine 5 mg.  Continue carvedilol 3.125 mg 2 times daily. Patient to return for blood pressure check in 2 weeks.  If blood pressures not at goal will titrate amlodipine to 10 mg and increase carvedilol to 6.25 mg twice daily.  3. Need for hepatitis C screening test - Hepatitis c vrs RNA detect by PCR-qual  4. Hyperlipidemia, unspecified hyperlipidemia type -Currently prescribed statin therapy Lipitor 20 mg once daily.  Patient  reports compliance with medication.  Hyperlipidemia with newly diagnosed repeating a fasting lipid panel today. - Lipid panel   Follow-up: 2 weeks nurse visit blood pressure check and 6 weeks provider diabetes and hypertension follow-up.   Joaquin Courts, FNP Primary Care at [facility] [address], [place] [address] [phone]: [phone]

## 2018-11-07 NOTE — Patient Instructions (Signed)
Increase Amlodipine 5 mg increased to improve blood pressure control.   Blood Glucose Monitoring, Adult Monitoring your blood sugar (glucose) is an important part of managing your diabetes (diabetes mellitus). Blood glucose monitoring involves checking your blood glucose as often as directed and keeping a record (log) of your results over time. Checking your blood glucose regularly and keeping a blood glucose log can:  Help you and your health care provider adjust your diabetes management plan as needed, including your medicines or insulin.  Help you understand how food, exercise, illnesses, and medicines affect your blood glucose.  Let you know what your blood glucose is at any time. You can quickly find out if you have low blood glucose (hypoglycemia) or high blood glucose (hyperglycemia). Your health care provider will set individualized treatment goals for you. Your goals will be based on your age, other medical conditions you have, and how you respond to diabetes treatment. Generally, the goal of treatment is to maintain the following blood glucose levels:  Before meals (preprandial): 80-130 mg/dL (9.3-7.9 mmol/L).  After meals (postprandial): below 180 mg/dL (10 mmol/L).  A1c level: less than 7%. Supplies needed:  Blood glucose meter.  Test strips for your meter. Each meter has its own strips. You must use the strips that came with your meter.  A needle to prick your finger (lancet). Do not use a lancet more than one time.  A device that holds the lancet (lancing device).  A journal or log book to write down your results. How to check your blood glucose  1. Wash your hands with soap and water. 2. Prick the side of your finger (not the tip) with the lancet. Use a different finger each time. 3. Gently rub the finger until a small drop of blood appears. 4. Follow instructions that come with your meter for inserting the test strip, applying blood to the strip, and using your  blood glucose meter. 5. Write down your result and any notes. Some meters allow you to use areas of your body other than your finger (alternative sites) to test your blood. The most common alternative sites are:  Forearm.  Thigh.  Palm of the hand. If you think you may have hypoglycemia, or if you have a history of not knowing when your blood glucose is getting low (hypoglycemia unawareness), do not use alternative sites. Use your finger instead. Alternative sites may not be as accurate as the fingers, because blood flow is slower in these areas. This means that the result you get may be delayed, and it may be different from the result that you would get from your finger. Follow these instructions at home: Blood glucose log   Every time you check your blood glucose, write down your result. Also write down any notes about things that may be affecting your blood glucose, such as your diet and exercise for the day. This information can help you and your health care provider: ? Look for patterns in your blood glucose over time. ? Adjust your diabetes management plan as needed.  Check if your meter allows you to download your records to a computer. Most glucose meters store a record of glucose readings in the meter. If you have type 1 diabetes:  Check your blood glucose 2 or more times a day.  Also check your blood glucose: ? Before every insulin injection. ? Before and after exercise. ? Before meals. ? 2 hours after a meal. ? Occasionally between 2:00 a.m. and 3:00 a.m., as  directed. ? Before potentially dangerous tasks, like driving or using heavy machinery. ? At bedtime.  You may need to check your blood glucose more often, up to 6-10 times a day, if you: ? Use an insulin pump. ? Need multiple daily injections (MDI). ? Have diabetes that is not well-controlled. ? Are ill. ? Have a history of severe hypoglycemia. ? Have hypoglycemia unawareness. If you have type 2 diabetes:  If  you take insulin or other diabetes medicines, check your blood glucose 2 or more times a day.  If you are on intensive insulin therapy, check your blood glucose 4 or more times a day. Occasionally, you may also need to check between 2:00 a.m. and 3:00 a.m., as directed.  Also check your blood glucose: ? Before and after exercise. ? Before potentially dangerous tasks, like driving or using heavy machinery.  You may need to check your blood glucose more often if: ? Your medicine is being adjusted. ? Your diabetes is not well-controlled. ? You are ill. General tips  Always keep your supplies with you.  If you have questions or need help, all blood glucose meters have a 24-hour "hotline" phone number that you can call. You may also contact your health care provider.  After you use a few boxes of test strips, adjust (calibrate) your blood glucose meter by following instructions that came with your meter. Contact a health care provider if:  Your blood glucose is at or above 240 mg/dL (65.713.3 mmol/L) for 2 days in a row.  You have been sick or have had a fever for 2 days or longer, and you are not getting better.  You have any of the following problems for more than 6 hours: ? You cannot eat or drink. ? You have nausea or vomiting. ? You have diarrhea. Get help right away if:  Your blood glucose is lower than 54 mg/dL (3 mmol/L).  You become confused or you have trouble thinking clearly.  You have difficulty breathing.  You have moderate or large ketone levels in your urine. Summary  Monitoring your blood sugar (glucose) is an important part of managing your diabetes (diabetes mellitus).  Blood glucose monitoring involves checking your blood glucose as often as directed and keeping a record (log) of your results over time.  Your health care provider will set individualized treatment goals for you. Your goals will be based on your age, other medical conditions you have, and how you  respond to diabetes treatment.  Every time you check your blood glucose, write down your result. Also write down any notes about things that may be affecting your blood glucose, such as your diet and exercise for the day. This information is not intended to replace advice given to you by your health care provider. Make sure you discuss any questions you have with your health care provider. Document Released: 05/27/2003 Document Revised: 04/04/2017 Document Reviewed: 11/03/2015 Elsevier Interactive Patient Education  2019 [employer]

## 2018-11-08 LAB — MICROALBUMIN, URINE: Microalbumin, Urine: 26.4 ug/mL

## 2018-11-08 NOTE — Telephone Encounter (Signed)
Highlighted areas completed. Sent forms & visit notes/imaging/EKG to Alfonso Patten. Awaiting confirmation.

## 2018-11-08 NOTE — Telephone Encounter (Signed)
Received a communication error. Called number on form to see if there was an alternate fax number. Had to leave voicemail.

## 2018-11-08 NOTE — Telephone Encounter (Signed)
Please complete highlighted area on documents, Print notes from January 21 urgent care visit, January 30 admission and any imaging or EKG associated with admission, 08/07/18 office visit and labs, 09/27/18 office visit, and 11/07/18 office visit. Fax completed forms to the attention of Alfonso Patten at [phone].

## 2018-11-09 ENCOUNTER — Other Ambulatory Visit: Payer: Self-pay | Admitting: Family Medicine

## 2018-11-09 DIAGNOSIS — E785 Hyperlipidemia, unspecified: Secondary | ICD-10-CM

## 2018-11-09 DIAGNOSIS — E119 Type 2 diabetes mellitus without complications: Secondary | ICD-10-CM

## 2018-11-09 LAB — LIPID PANEL
Chol/HDL Ratio: 4.3 ratio (ref 0.0–5.0)
Cholesterol, Total: 241 mg/dL — ABNORMAL HIGH (ref 100–199)
HDL: 56 mg/dL (ref >39)
LDL Calculated: 162 mg/dL — ABNORMAL HIGH (ref 0–99)
Triglycerides: 117 mg/dL (ref 0–149)
VLDL Cholesterol Cal: 23 mg/dL (ref 5–40)

## 2018-11-09 LAB — COMPREHENSIVE METABOLIC PANEL
ALT: 28 IU/L (ref 0–44)
AST: 43 IU/L — ABNORMAL HIGH (ref 0–40)
Albumin/Globulin Ratio: 1.6 (ref 1.2–2.2)
Albumin: 4.3 g/dL (ref 3.8–4.9)
Alkaline Phosphatase: 56 IU/L (ref 39–117)
BUN/Creatinine Ratio: 9 (ref 9–20)
BUN: 10 mg/dL (ref 6–24)
Bilirubin Total: 0.4 mg/dL (ref 0.0–1.2)
CO2: 26 mmol/L (ref 20–29)
Calcium: 9.5 mg/dL (ref 8.7–10.2)
Chloride: 100 mmol/L (ref 96–106)
Creatinine, Ser: 1.13 mg/dL (ref 0.76–1.27)
GFR calc Af Amer: 85 mL/min/{1.73_m2} (ref >59)
GFR calc non Af Amer: 73 mL/min/{1.73_m2} (ref >59)
Globulin, Total: 2.7 g/dL (ref 1.5–4.5)
Glucose: 188 mg/dL — ABNORMAL HIGH (ref 65–99)
Potassium: 4.6 mmol/L (ref 3.5–5.2)
Sodium: 138 mmol/L (ref 134–144)
Total Protein: 7 g/dL (ref 6.0–8.5)

## 2018-11-09 LAB — HEMOGLOBIN A1C
Est. average glucose Bld gHb Est-mCnc: 157 mg/dL
Hgb A1c MFr Bld: 7.1 % — ABNORMAL HIGH (ref 4.8–5.6)

## 2018-11-09 LAB — HEPATITIS C VRS RNA DETECT BY PCR-QUAL: HCV RNA NAA Qualitative: NEGATIVE

## 2018-11-09 NOTE — Telephone Encounter (Signed)
Zachary Campos called back & gave an alternate fax [phone]).

## 2018-11-09 NOTE — Telephone Encounter (Signed)
Received multiple communication errors with the alternate fax number as well.

## 2018-11-09 NOTE — Telephone Encounter (Signed)
Recent labs indicate worsening diabetes: A1C increased to 7.1, increase metformin 1000 mg twice daily with meals Cholesterol panel is abnormal, atorvastatin increase 40 mg once daily.   Return for follow-up in 3 months.

## 2018-11-09 NOTE — Telephone Encounter (Signed)
No response from [employer]. Attempted to fax to the number below & received communication errors from both fax machines.

## 2018-11-13 MED ORDER — ATORVASTATIN CALCIUM 40 MG PO TABS: 40.0000 mg | ORAL_TABLET | Freq: Every day | ORAL | 1 refills | Status: DC

## 2018-11-13 MED ORDER — METFORMIN HCL 1000 MG PO TABS: 1000.0000 mg | ORAL_TABLET | Freq: Two times a day (BID) | ORAL | 1 refills | Status: DC

## 2018-11-13 NOTE — Telephone Encounter (Signed)
Still continuing to get communication errors. Patient stopped by office to give alternate fax number which ended up being the [phone] number.

## 2018-11-13 NOTE — Telephone Encounter (Signed)
Patient notified of lab results & recommendations. Expressed understanding. Is agreeable to medication changes. Made follow up appointment on 02/07/2019. Prescriptions sent to pharmacy on file.

## 2018-11-14 NOTE — Telephone Encounter (Signed)
Patient states that he took the sealed copy of the forms to his job & they were accepted.

## 2018-11-21 ENCOUNTER — Other Ambulatory Visit: Payer: Self-pay

## 2018-11-21 ENCOUNTER — Ambulatory Visit (INDEPENDENT_AMBULATORY_CARE_PROVIDER_SITE_OTHER): Payer: BC Managed Care – PPO

## 2018-11-21 VITALS — BP 133/92 | HR 83 | Temp 98.1°F

## 2018-11-21 DIAGNOSIS — I10 Essential (primary) hypertension: Secondary | ICD-10-CM

## 2018-11-21 DIAGNOSIS — Z0131 Encounter for examination of blood pressure with abnormal findings: Secondary | ICD-10-CM

## 2018-11-21 MED ORDER — CARVEDILOL 6.25 MG PO TABS: 6.2500 mg | ORAL_TABLET | Freq: Two times a day (BID) | ORAL | 2 refills | Status: DC

## 2018-11-21 MED ORDER — AMLODIPINE BESYLATE 10 MG PO TABS: 10.0000 mg | ORAL_TABLET | Freq: Every day | ORAL | 3 refills | Status: DC

## 2018-11-21 NOTE — Progress Notes (Signed)
Patient here for BP check. Patient has taken BP medication this morning. BP was 161/113, pulse 87. Let patient sit longer & BP was 133/92, pulse 83. Spoke with provider who states to have patient increase Amlodipine to 10 mg daily & Carvedilol to 6.25 mg BID. New prescriptions have been sent to pharmacy. Patient repeated new dose changes back correctly. Patient has an appointment with provider on 12/19/2018 will reevaluate BP then.  KWalker, CMA.

## 2018-11-21 NOTE — Patient Instructions (Addendum)
Your blood pressure medication doses have been changed follows:  Amlodipine 10 mg once daily.  Carvedilol 6.125 mg twice daily.    Hypertension Hypertension is another name for high blood pressure. High blood pressure forces your heart to work harder to pump blood. This can cause problems over time. There are two numbers in a blood pressure reading. There is a top number (systolic) over a bottom number (diastolic). It is best to have a blood pressure below 120/80. Healthy choices can help lower your blood pressure. You may need medicine to help lower your blood pressure if:  Your blood pressure cannot be lowered with healthy choices.  Your blood pressure is higher than 130/80. Follow these instructions at home: Eating and drinking   If directed, follow the DASH eating plan. This diet includes: ? Filling half of your plate at each meal with fruits and vegetables. ? Filling one quarter of your plate at each meal with whole grains. Whole grains include whole wheat pasta, brown rice, and whole grain bread. ? Eating or drinking low-fat dairy products, such as skim milk or low-fat yogurt. ? Filling one quarter of your plate at each meal with low-fat (lean) proteins. Low-fat proteins include fish, skinless chicken, eggs, beans, and tofu. ? Avoiding fatty meat, cured and processed meat, or chicken with skin. ? Avoiding premade or processed food.  Eat less than 1,500 mg of salt (sodium) a day.  Limit alcohol use to no more than 1 drink a day for nonpregnant women and 2 drinks a day for men. One drink equals 12 oz of beer, 5 oz of wine, or 1 oz of hard liquor. Lifestyle  Work with your doctor to stay at a healthy weight or to lose weight. Ask your doctor what the best weight is for you.  Get at least 30 minutes of exercise that causes your heart to beat faster (aerobic exercise) most days of the week. This may include walking, swimming, or biking.  Get at least 30 minutes of exercise that  strengthens your muscles (resistance exercise) at least 3 days a week. This may include lifting weights or pilates.  Do not use any products that contain nicotine or tobacco. This includes cigarettes and e-cigarettes. If you need help quitting, ask your doctor.  Check your blood pressure at home as told by your doctor.  Keep all follow-up visits as told by your doctor. This is important. Medicines  Take over-the-counter and prescription medicines only as told by your doctor. Follow directions carefully.  Do not skip doses of blood pressure medicine. The medicine does not work as well if you skip doses. Skipping doses also puts you at risk for problems.  Ask your doctor about side effects or reactions to medicines that you should watch for. Contact a doctor if:  You think you are having a reaction to the medicine you are taking.  You have headaches that keep coming back (recurring).  You feel dizzy.  You have swelling in your ankles.  You have trouble with your vision. Get help right away if:  You get a very bad headache.  You start to feel confused.  You feel weak or numb.  You feel faint.  You get very bad pain in your: ? Chest. ? Belly (abdomen).  You throw up (vomit) more than once.  You have trouble breathing. Summary  Hypertension is another name for high blood pressure.  Making healthy choices can help lower blood pressure. If your blood pressure cannot be controlled  with healthy choices, you may need to take medicine. This information is not intended to replace advice given to you by your health care provider. Make sure you discuss any questions you have with your health care provider. Document Released: 11/10/2007 Document Revised: 04/21/2016 Document Reviewed: 04/21/2016 Elsevier Interactive Patient Education  2019 [employer]

## 2018-11-22 ENCOUNTER — Telehealth: Payer: Self-pay | Admitting: Family Medicine

## 2018-11-22 NOTE — Telephone Encounter (Signed)
Patient needs a copy of the paperwork pt wants to see how long did she extend the [facility]

## 2018-12-18 ENCOUNTER — Telehealth: Payer: Self-pay

## 2018-12-18 NOTE — Telephone Encounter (Signed)
Called patient to do their pre-visit COVID screening.  Have you been tested for COVID or are you currently waiting for COVID test results? no  Have you recently traveled internationally([place], [place], [place], [place], [place]) or within the [place] to a hotspot area([place], [place], [place], [place], [place])? no  Are you currently experiencing any of the following: fever, cough, SHOB, fatigue, body aches, loss of smell, rash, diarrhea, vomiting, severe headaches, weakness, sore throat? no  Have you been in contact with anyone who has recently travelled? no  Have you been in contact with anyone who is experiencing any of the above symptoms or been diagnosed with [place]  or works in or has recently visited a SNF? No  Asked patient to come in fasting for repeat labs.

## 2018-12-19 ENCOUNTER — Ambulatory Visit (INDEPENDENT_AMBULATORY_CARE_PROVIDER_SITE_OTHER): Payer: BC Managed Care – PPO | Admitting: Family Medicine

## 2018-12-19 ENCOUNTER — Encounter: Payer: Self-pay | Admitting: Family Medicine

## 2018-12-19 ENCOUNTER — Other Ambulatory Visit: Payer: Self-pay

## 2018-12-19 VITALS — BP 143/99 | HR 97 | Temp 97.5°F | Resp 17 | Ht 72.0 in | Wt 197.0 lb

## 2018-12-19 DIAGNOSIS — R079 Chest pain, unspecified: Secondary | ICD-10-CM

## 2018-12-19 DIAGNOSIS — I10 Essential (primary) hypertension: Secondary | ICD-10-CM

## 2018-12-19 DIAGNOSIS — R42 Dizziness and giddiness: Secondary | ICD-10-CM | POA: Diagnosis not present

## 2018-12-19 DIAGNOSIS — R0602 Shortness of breath: Secondary | ICD-10-CM | POA: Diagnosis not present

## 2018-12-19 DIAGNOSIS — Z720 Tobacco use: Secondary | ICD-10-CM

## 2018-12-19 DIAGNOSIS — E785 Hyperlipidemia, unspecified: Secondary | ICD-10-CM

## 2018-12-19 MED ORDER — CARVEDILOL 6.25 MG PO TABS: 12.5000 mg | ORAL_TABLET | Freq: Two times a day (BID) | ORAL | 2 refills | Status: DC

## 2018-12-19 NOTE — Progress Notes (Signed)
Subjective:    Patient here for follow-up of elevated blood pressure. He is now monitoring his  blood pressure at home. He is inactive of routine exercise, current daily smoker, and endorses daily intake of alcohol. Blood pressures have averaged 140's-160's/ 99. Blood pressure readings during prior visits, have remained markedly elevated. He is newly diagnosed type 2 diabetes and takes Metformin. He has no known history of CHF or CAD. He endorses recent intermittent chest pain and shortness of breath. Shortness of breath resolves with rest. SOB occasionally occurs at rest, although non-worrisome.  He has suffered from recurrent dizziness since his visit in March thought to be related to orthostatic changes-however, dizziness is now more frequent and constantly feels "off-balance". No new falls since March. His recent lipid panel indicated poor cholesterol health.  Statin therapy increased to facilitate improved cholesterol health. He unfortunately continues to smoke although he is down to 1 pack lasting at least 3 days.  His prior habit was smoking 1 pack of cigarettes per day. Pack is now last a a few day. Shortness of breath with exertional activities and have to take frequent breaks.  Social History   Socioeconomic History  . Marital status: Married    Spouse name: Not on file  . Number of children: 1  . Years of education: Not on file  . Highest education level: Not on file  Occupational History  . 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 Financial resource strain: Not on file  . Food insecurity    Worry: Not on file    Inability: Not on file  . Transportation needs    Medical: Not on file    Non-medical: Not on file  Tobacco Use  . Smoking status: Current Every Day Smoker    Packs/day: 0.50    Types: Cigarettes  . Smokeless tobacco: Never Used  Substance and Sexual Activity  . Alcohol use: Yes    Comment: averages 1 40oz beer every evening  . Drug use: No  . Sexual activity: Not  on file  Lifestyle  . Physical activity    Days per week: Not on file    Minutes per session: Not on file  . Stress: Not on file  Relationships  . Social Musicianconnections    Talks on phone: Not on file    Gets together: Not on file    Attends religious service: Not on file    Active member of club or organization: Not on file    Attends meetings of clubs or organizations: Not on file    Relationship status: Not on file  . Intimate partner violence    Fear of current or ex partner: Not on file    Emotionally abused: Not on file    Physically abused: Not on file    Forced sexual activity: Not on file  Other Topics Concern  . 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      Review of Systems Pertinent items are noted in HPI.     Objective:  General appearance: alert, well developed, well nourished, cooperative and in no distress Head: Normocephalic, without obvious abnormality, atraumatic Respiratory: Respirations even and unlabored, normal respiratory rate Heart: rate and rhythm normal. Positive  Gallop.No murmurs on exam. Abdomen: BS +, no distention, no rebound tenderness, or no mass Extremities: No gross deformities Skin: Skin color, texture, turgor normal. No rashes seen  Psych: Appropriate mood and affect. Neurologic: Mental status: Alert, oriented to  person, place, and time, thought content appropriate.     Assessment and Plan:  1. Hyperlipidemia, unspecified hyperlipidemia type -Continue increase statin therapy, atorvastatin 40 mg once daily at 6 PM. -Encourage smoking cessation  2. Essential hypertension -Blood pressure remains not at goal, increase carvedilol to 12.5 mg twice daily.  Continue amlodipine as prescribed. - Ambulatory referral to Cardiology  3. Tobacco abuse -Encourage cessation  4. Dizziness of unknown cause -Suspect possible underlying cardiac involvement given shortness of breath and recent chest pain -  Ambulatory referral to Cardiology  5. Chest pain, unspecified type -Suspect possible underlying coronary artery disease given patient's risk factors of smoking, male, overweight, and poor diet. -EKG normal sinus rhythm no ST changes no evidence of ischemia - Ambulatory referral to Cardiology  6. Shortness of breath Cardiac versus pulmonary etiology Encourage smoking cessation - Ambulatory referral to Cardiology   F/U: 1 month for return to work evaluation    Molli Barrows, FNP Primary Care at [facility] [address] [place] [phone]: [phone]

## 2018-12-19 NOTE — Patient Instructions (Addendum)
I have referred to cardiology due to chest pain and shortness of breath. Continue current blood pressure medication. Increased Carvedilol 12.5 mg and 12.5 mg BID.     Hypertension, Adult Hypertension is another name for high blood pressure. High blood pressure forces your heart to work harder to pump blood. This can cause problems over time. There are two numbers in a blood pressure reading. There is a top number (systolic) over a bottom number (diastolic). It is best to have a blood pressure that is below 120/80. Healthy choices can help lower your blood pressure, or you may need medicine to help lower it. What are the causes? The cause of this condition is not known. Some conditions may be related to high blood pressure. What increases the risk?  Smoking.  Having type 2 diabetes mellitus, high cholesterol, or both.  Not getting enough exercise or physical activity.  Being overweight.  Having too much fat, sugar, calories, or salt (sodium) in your diet.  Drinking too much alcohol.  Having long-term (chronic) kidney disease.  Having a family history of high blood pressure.  Age. Risk increases with age.  Race. You may be at higher risk if you are African American.  Gender. Men are at higher risk than women before age 58. After age 51, women are at higher risk than men.  Having obstructive sleep apnea.  Stress. What are the signs or symptoms?  High blood pressure may not cause symptoms. Very high blood pressure (hypertensive crisis) may cause: ? Headache. ? Feelings of worry or nervousness (anxiety). ? Shortness of breath. ? Nosebleed. ? A feeling of being sick to your stomach (nausea). ? Throwing up (vomiting). ? Changes in how you see. ? Very bad chest pain. ? Seizures. How is this treated?  This condition is treated by making healthy lifestyle changes, such as: ? Eating healthy foods. ? Exercising more. ? Drinking less alcohol.  Your health care provider  may prescribe medicine if lifestyle changes are not enough to get your blood pressure under control, and if: ? Your top number is above 130. ? Your bottom number is above 80.  Your personal target blood pressure may vary. Follow these instructions at home: Eating and drinking   If told, follow the DASH eating plan. To follow this plan: ? Fill one half of your plate at each meal with fruits and vegetables. ? Fill one fourth of your plate at each meal with whole grains. Whole grains include whole-wheat pasta, brown rice, and whole-grain bread. ? Eat or drink low-fat dairy products, such as skim milk or low-fat yogurt. ? Fill one fourth of your plate at each meal with low-fat (lean) proteins. Low-fat proteins include fish, chicken without skin, eggs, beans, and tofu. ? Avoid fatty meat, cured and processed meat, or chicken with skin. ? Avoid pre-made or processed food.  Eat less than 1,500 mg of salt each day.  Do not drink alcohol if: ? Your doctor tells you not to drink. ? You are pregnant, may be pregnant, or are planning to become pregnant.  If you drink alcohol: ? Limit how much you use to:  0-1 drink a day for women.  0-2 drinks a day for men. ? Be aware of how much alcohol is in your drink. In the [place] one drink equals one 12 oz bottle of beer (355 mL), one 5 oz glass of wine (148 mL), or one 1 oz glass of hard liquor (44 mL). Lifestyle   Work with your  doctor to stay at a healthy weight or to lose weight. Ask your doctor what the best weight is for you.  Get at least 30 minutes of exercise most days of the week. This may include walking, swimming, or biking.  Get at least 30 minutes of exercise that strengthens your muscles (resistance exercise) at least 3 days a week. This may include lifting weights or doing Pilates.  Do not use any products that contain nicotine or tobacco, such as cigarettes, e-cigarettes, and chewing tobacco. If you need help quitting, ask your  doctor.  Check your blood pressure at home as told by your doctor.  Keep all follow-up visits as told by your doctor. This is important. Medicines  Take over-the-counter and prescription medicines only as told by your doctor. Follow directions carefully.  Do not skip doses of blood pressure medicine. The medicine does not work as well if you skip doses. Skipping doses also puts you at risk for problems.  Ask your doctor about side effects or reactions to medicines that you should watch for. Contact a doctor if you:  Think you are having a reaction to the medicine you are taking.  Have headaches that keep coming back (recurring).  Feel dizzy.  Have swelling in your ankles.  Have trouble with your vision. Get help right away if you:  Get a very bad headache.  Start to feel mixed up (confused).  Feel weak or numb.  Feel faint.  Have very bad pain in your: ? Chest. ? Belly (abdomen).  Throw up more than once.  Have trouble breathing. Summary  Hypertension is another name for high blood pressure.  High blood pressure forces your heart to work harder to pump blood.  For most people, a normal blood pressure is less than 120/80.  Making healthy choices can help lower blood pressure. If your blood pressure does not get lower with healthy choices, you may need to take medicine. This information is not intended to replace advice given to you by your health care provider. Make sure you discuss any questions you have with your health care provider. Document Released: 11/10/2007 Document Revised: 02/01/2018 Document Reviewed: 02/01/2018 Elsevier Patient Education  2020 [employer]

## 2019-01-17 ENCOUNTER — Ambulatory Visit: Payer: BC Managed Care – PPO | Admitting: Nurse Practitioner

## 2019-01-21 NOTE — Progress Notes (Signed)
Cardiology Office Note:   Date:  01/22/2019  NAME:  Zachary Campos    MRN: [id number] DOB:  [birth year]   PCP:  Scot Jun, FNP  Cardiologist:  No primary care provider on file.  Electrophysiologist:  None   Referring MD: Scot Jun, FNP   Chief Complaint  Patient presents with  . Palpitations   History of Present Illness:   Zachary Campos is a 53 y.o. male with a hx of diabetes, hypertension, hyperlipidemia, tobacco abuse who is being seen today for the evaluation of dizziness, chest pain, palpitations at the request of Scot Jun, FNP.  Zachary Campos reports nearly 4 months of palpitations that are brought on by exertion and relieved with rest.  He reports a recent diagnosis of episodic hypertension associated with his palpitations.  He was started on antihypertensive therapies in February of this year and actually hospitalized due to hypotension.  He has since been out of work due to the sensation of palpitations and dizziness with activity.  It appears his primary care physician is attributed his symptoms to episodic hypertension.  His current blood pressure medications include amlodipine 10 mg twice daily and carvedilol 12.5 mg twice daily.  He has a history of diabetes however he is only on metformin, most recent A1c 7.1.  He is noted to have a cholesterol level that is elevated on most recent lipid profile.  He reports no chest pressure or pain with exertion, the main symptoms seem to be dizziness and palpitations.  His EKG here shows normal sinus rhythm, with PVCs.  He reports the palpitations do not occur at rest, they mainly occur with exertion.  They appear to be relieved by rest and when he stops activity.  No prior history of MI, stroke, CHF.  He does not report any symptoms of congestive heart failure, lower extremity edema.  He continues to smoke half pack a day and has done so for over 35 years.  He reports an interest to quit and cut back.  Past Medical  History: Past Medical History:  Diagnosis Date  . Epistaxis 08/07/2012  . Hypercholesterolemia 08/08/2012   Lipid panel 08/07/2012: Cholesterol 245, triglycerides 287, HDL 46, LDL 142 (10-yr CVD risk 9.7%).   . Hypertension     Past Surgical History: History reviewed. No pertinent surgical history.  Current Medications: Current Meds  Medication Sig  . Accu-Chek FastClix Lancets MISC   . amLODipine (NORVASC) 10 MG tablet Take 1 tablet (10 mg total) by mouth daily.  Marland Kitchen atorvastatin (LIPITOR) 40 MG tablet Take 1 tablet (40 mg total) by mouth daily at 6 PM.  . blood glucose meter kit and supplies Dispense based on patient and insurance preference. Use up to twice daily as directed. (FOR ICD-10 E10.9, E11.9).  Marland Kitchen ipratropium (ATROVENT) 0.03 % nasal spray Place 2 sprays into both nostrils 2 (two) times daily.  . metFORMIN (GLUCOPHAGE) 1000 MG tablet Take 1 tablet (1,000 mg total) by mouth 2 (two) times daily with a meal.  . [DISCONTINUED] carvedilol (COREG) 6.25 MG tablet Take 2 tablets (12.5 mg total) by mouth 2 (two) times daily with a meal.     Allergies:    Patient has no known allergies.   Social History: Social History   Socioeconomic History  . Marital status: Married    Spouse name: Not on file  . Number of children: 1  . Years of education: Not on file  . Highest education level: Not on file  Occupational  History  . Occupation: Fish farm manager  . Financial resource strain: Not on file  . Food insecurity    Worry: Not on file    Inability: Not on file  . Transportation needs    Medical: Not on file    Non-medical: Not on file  Tobacco Use  . Smoking status: Current Every Day Smoker    Packs/day: 0.50    Years: 35.00    Pack years: 17.50    Types: Cigarettes  . Smokeless tobacco: Never Used  Substance and Sexual Activity  . Alcohol use: Yes    Comment: averages 1 40oz beer every evening  . Drug use: No  . Sexual activity: Not on file  Lifestyle  .  Physical activity    Days per week: Not on file    Minutes per session: Not on file  . Stress: Not on file  Relationships  . Social Herbalist on phone: Not on file    Gets together: Not on file    Attends religious service: Not on file    Active member of club or organization: Not on file    Attends meetings of clubs or organizations: Not on file    Relationship status: Not on file  Other Topics Concern  . Not on file  Social History Narrative   Works at a Agricultural engineer.     Family History: The patient's family history includes Bladder Cancer in his mother; Hyperlipidemia in his mother; Hypertension in his mother. There is no history of Heart disease, Stroke, or Diabetes.  ROS:   All other ROS reviewed and negative. Pertinent positives noted in the HPI.     EKGs/Labs/Other Studies Reviewed:   The following studies were personally reviewed by me today:  EKG:  EKG is ordered today.  The ekg ordered today demonstrates sinus rhythm, heart rate 90, normal intervals, PVC, and was personally reviewed by me.   Recent Labs: 08/07/2018: Hemoglobin 14.0; Platelets 135; TSH 0.840 11/07/2018: ALT 28; BUN 10; Creatinine, Ser 1.13; Potassium 4.6; Sodium 138   Recent Lipid Panel    Component Value Date/Time   CHOL 241 (H) 11/07/2018 0916   TRIG 117 11/07/2018 0916   HDL 56 11/07/2018 0916   CHOLHDL 4.3 11/07/2018 0916   CHOLHDL 4.5 07/06/2018 0755   VLDL 40 07/06/2018 0755   LDLCALC 162 (H) 11/07/2018 0916    Physical Exam:   VS:  BP (!) 162/100   Pulse (!) 110   Temp (!) 97 F (36.1 C)   Ht 6' (1.829 m)   Wt 198 lb 9.6 oz (90.1 kg)   SpO2 98%   BMI 26.94 kg/m    Wt Readings from Last 3 Encounters:  01/22/19 198 lb 9.6 oz (90.1 kg)  12/19/18 197 lb (89.4 kg)  11/07/18 197 lb 3.2 oz (89.4 kg)    General: Well nourished, well developed, in no acute distress Heart: Atraumatic, normal size  Eyes: PEERLA, EOMI  Neck: Supple, no JVD Endocrine: No  thryomegaly Cardiac: Normal S1, S2; RRR; no murmurs, rubs, or gallops Lungs: Clear to auscultation bilaterally, no wheezing, rhonchi or rales  Abd: Soft, nontender, no hepatomegaly  Ext: No edema, pulses 2+ Musculoskeletal: No deformities, BUE and BLE strength normal and equal Skin: Warm and dry, no rashes   Neuro: Alert and oriented to person, place, time, and situation, CNII-XII grossly intact, no focal deficits  Psych: Normal mood and affect   ASSESSMENT:   NAME@ is a  53 y.o. male who presents for the following: 1. Palpitations   2. Chest pain of uncertain etiology   3. Dizziness   4. Tobacco abuse   5. Mixed hyperlipidemia   6. Essential hypertension     PLAN:   1. Palpitations -Unclear etiology, but appear to over started to occur over the past few months.  Review of labs demonstrates a normal TSH.  He reports daily alcohol use, 1-2 beers but does not appear to have alcohol use disorder.  Symptoms seem to be related to activity as they always occur with exertion. -We will pursue an echocardiogram to exclude structural heart disease for symptoms -Given that he has these palpitations with exertion, we will pursue an exercise treadmill stress test to assess for the likelihood of possibly obstructive coronary disease as well as any exercise-induced arrhythmias -After the above work-up we will consider further long-term ECG monitoring if needed right colectomy  2. Chest pain of uncertain etiology -Symptoms appear to be more related to palpitations and dizziness with exertion -Echocardiogram and exercise treadmill as above  3. Dizziness -Related to palpitations with work-up as above  4. Tobacco abuse -Counseled on smoking cessation  5. Mixed hyperlipidemia -Most recent LDL 162, and started on statin therapy -We will check a repeat cholesterol panel at the time of his stress test   6. HTN -Coreg increased to 25 mg twice daily  Disposition: Return in about 4 weeks (around  02/19/2019).  Medication Adjustments/Labs and Tests Ordered: Current medicines are reviewed at length with the patient today.  Concerns regarding medicines are outlined above.  Orders Placed This Encounter  Procedures  . Lipid panel  . Exercise Tolerance Test  . EKG 12-Lead  . ECHOCARDIOGRAM COMPLETE   Meds ordered this encounter  Medications  . carvedilol (COREG) 25 MG tablet    Sig: Take 1 tablet (25 mg total) by mouth 2 (two) times daily.    Dispense:  60 tablet    Refill:  6    Patient Instructions  1. Increased carvedilol to 25 mg twice daily (Of your current prescription, 6.25 mg, you make take 4 tablets in the morning and 4 tablets at night) - Once you refill this, take once in the morning and once at night   2. We will re-check your cholesterol   3. You will get a heart ultrasound and stress test     Signed, [place] T. Audie Box, [place]  [address] [place], [place] [address] [phone]  01/22/2019 5:02 PM

## 2019-01-22 ENCOUNTER — Other Ambulatory Visit: Payer: Self-pay

## 2019-01-22 ENCOUNTER — Ambulatory Visit (INDEPENDENT_AMBULATORY_CARE_PROVIDER_SITE_OTHER): Payer: BC Managed Care – PPO | Admitting: Cardiovascular Disease

## 2019-01-22 ENCOUNTER — Encounter: Payer: Self-pay | Admitting: Cardiovascular Disease

## 2019-01-22 VITALS — BP 162/100 | HR 110 | Temp 97.0°F | Ht 72.0 in | Wt 198.6 lb

## 2019-01-22 DIAGNOSIS — R0789 Other chest pain: Secondary | ICD-10-CM

## 2019-01-22 DIAGNOSIS — R002 Palpitations: Secondary | ICD-10-CM

## 2019-01-22 DIAGNOSIS — Z720 Tobacco use: Secondary | ICD-10-CM

## 2019-01-22 DIAGNOSIS — E782 Mixed hyperlipidemia: Secondary | ICD-10-CM

## 2019-01-22 DIAGNOSIS — I10 Essential (primary) hypertension: Secondary | ICD-10-CM

## 2019-01-22 DIAGNOSIS — R079 Chest pain, unspecified: Secondary | ICD-10-CM

## 2019-01-22 DIAGNOSIS — R42 Dizziness and giddiness: Secondary | ICD-10-CM

## 2019-01-22 MED ORDER — CARVEDILOL 25 MG PO TABS: 25.0000 mg | ORAL_TABLET | Freq: Two times a day (BID) | ORAL | 6 refills | Status: DC

## 2019-01-22 NOTE — Patient Instructions (Signed)
1. Increased carvedilol to 25 mg twice daily (Of your current prescription, 6.25 mg, you make take 4 tablets in the morning and 4 tablets at night) - Once you refill this, take once in the morning and once at night   2. We will re-check your cholesterol   3. You will get a heart ultrasound and stress test

## 2019-01-24 ENCOUNTER — Encounter: Payer: Self-pay | Admitting: Nurse Practitioner

## 2019-01-24 ENCOUNTER — Ambulatory Visit (INDEPENDENT_AMBULATORY_CARE_PROVIDER_SITE_OTHER): Payer: BC Managed Care – PPO | Admitting: Nurse Practitioner

## 2019-01-24 ENCOUNTER — Other Ambulatory Visit: Payer: Self-pay

## 2019-01-24 ENCOUNTER — Encounter: Payer: Self-pay | Admitting: Cardiovascular Disease

## 2019-01-24 DIAGNOSIS — Z01812 Encounter for preprocedural laboratory examination: Secondary | ICD-10-CM

## 2019-01-24 DIAGNOSIS — R42 Dizziness and giddiness: Secondary | ICD-10-CM | POA: Insufficient documentation

## 2019-01-24 DIAGNOSIS — R55 Syncope and collapse: Secondary | ICD-10-CM

## 2019-01-24 DIAGNOSIS — R0789 Other chest pain: Secondary | ICD-10-CM | POA: Diagnosis not present

## 2019-01-24 DIAGNOSIS — I10 Essential (primary) hypertension: Secondary | ICD-10-CM | POA: Diagnosis not present

## 2019-01-24 DIAGNOSIS — R002 Palpitations: Secondary | ICD-10-CM | POA: Diagnosis not present

## 2019-01-24 NOTE — Progress Notes (Signed)
Virtual Visit via Telephone Note Due to national recommendations of social distancing due to COVID 19, telehealth visit is felt to be most appropriate for this patient at this time.  I discussed the limitations, risks, security and privacy concerns of performing an evaluation and management service by telephone and the availability of in person appointments. I also discussed with the patient that there may be a patient responsible charge related to this service. The patient expressed understanding and agreed to proceed.    I connected with Zachary Campos on 01/24/19  at   1:30 PM EDT  EDT by telephone and verified that I am speaking with the correct person using two identifiers.   Consent I discussed the limitations, risks, security and privacy concerns of performing an evaluation and management service by telephone and the availability of in person appointments. I also discussed with the patient that there may be a patient responsible charge related to this service. The patient expressed understanding and agreed to proceed.   Location of Patient: Private Residence   [facility]    Persons participating in Telemedicine visit: Zachary DenverZelda Cleatus Gabriel FNP-BC [place] CMA Zachary Campos    History of Present Illness: Telemedicine visit for: Follow up to Dizziness and Palpitations. Chronic smoker.  He was taken out of work by his PCP Joaquin CourtsKimberly Harris several months ago due to poorly controlled HTN, Dizziness, palpitations, headaches and epistaxis. He established care with her on 08-07-2018. At that time she was evaluating him for a hospital follow up for near syncope 07-06-2018. Per hospital note it was felt his near syncope was due to hypotension and his lisinopril and HCTZ were dc'd prior to his discharge.  During his office visit in March he was prescribed carvedilol 3.125 mg BID for tachycardia along with his current medication at that time of  amlodipine 2.5mg  for HTN. His blood pressure was normal a few weeks later during a nurse visit for BP Recheck.   Somewhere along that time he was written out of work and it is unclear if this was based on subjective or objective findings. He did have an elevated BP reading 187/120 at an office visit on 11-07-2018 with his PCP. However he had already been taken out of work prior to this.  His amlodipine was increased at that time as well as his carvedilol.   Unfortunately he had not been worked up for his symptoms until he was recently referred to cardiology for evaluation of shortness of breath and palpitation. 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 I have instructed him that if his cardiology work up is negative he will need to return to work and I would more than likely request that he not climb ladders while he is working.  I have instructed him that elevated blood pressure readings, headaches or palpitations would not be a reason for me to keep him out of work.    PER CARDIOLOGY NOTE ON 01-22-2019 1. Palpitations -Unclear etiology, but appear to over started to occur over the past few months.  Review of labs demonstrates a normal TSH.  He reports daily alcohol use, 1-2 beers but does not appear to have alcohol use disorder.  Symptoms seem to be related to activity as they always occur with exertion. -We will pursue an echocardiogram to exclude structural heart disease for symptoms -Given that he has these palpitations with exertion, we will pursue an exercise  treadmill stress test to assess for the likelihood of possibly obstructive coronary disease as well as any exercise-induced arrhythmias -After the above work-up we will consider further long-term ECG monitoring   2. Chest pain of uncertain etiology -Symptoms appear to be more related to palpitations and dizziness with exertion -Echocardiogram and exercise treadmill as above  3.  HTN -Coreg increased to 25 mg twice daily  Disposition: Return in about 4 weeks (around 02/19/2019).   I have instructed patient that we will re evaluate his return to work status after his cardiology workup is complete.   Past Medical History:  Diagnosis Date  . Epistaxis 08/07/2012  . Hypercholesterolemia 08/08/2012   Lipid panel 08/07/2012: Cholesterol 245, triglycerides 287, HDL 46, LDL 142 (10-yr CVD risk 9.7%).   . Hypertension     History reviewed. No pertinent surgical history.  Family History  Problem Relation Age of Onset  . Bladder Cancer Mother   . Hypertension Mother   . Hyperlipidemia Mother   . Heart disease Neg Hx   . Stroke Neg Hx   . Diabetes Neg Hx     Social History   Socioeconomic History  . Marital status: Married    Spouse name: Not on file  . Number of children: 1  . Years of education: Not on file  . Highest education level: Not on file  Occupational History  . Occupation: Fish farm manager  . Financial resource strain: Not on file  . Food insecurity    Worry: Not on file    Inability: Not on file  . Transportation needs    Medical: Not on file    Non-medical: Not on file  Tobacco Use  . Smoking status: Current Every Day Smoker    Packs/day: 0.50    Years: 35.00    Pack years: 17.50    Types: Cigarettes  . Smokeless tobacco: Never Used  Substance and Sexual Activity  . Alcohol use: Yes    Comment: averages 1 40oz beer every evening  . Drug use: No  . Sexual activity: Not on file  Lifestyle  . Physical activity    Days per week: Not on file    Minutes per session: Not on file  . Stress: Not on file  Relationships  . Social Herbalist on phone: Not on file    Gets together: Not on file    Attends religious service: Not on file    Active member of club or organization: Not on file    Attends meetings of clubs or organizations: Not on file    Relationship status: Not on file  Other Topics Concern  . Not on file   Social History Narrative   Works at a Agricultural [employer].     Observations/Objective: Awake, alert and oriented x 3   Review of Systems  Constitutional: Negative for fever, malaise/fatigue and weight loss.  HENT: Negative.  Negative for nosebleeds.   Eyes: Negative.  Negative for blurred vision, double vision and photophobia.  Respiratory: Negative.  Negative for cough and shortness of breath.   Cardiovascular: Positive for palpitations. Negative for chest pain and leg swelling.  Gastrointestinal: Negative.  Negative for heartburn, nausea and vomiting.  Musculoskeletal: Negative.  Negative for myalgias.  Neurological: Positive for dizziness. Negative for focal weakness, seizures and headaches.  Psychiatric/Behavioral: Negative.  Negative for suicidal ideas.    Assessment and Plan: Zayvien was seen today for rtw evaluation.  Diagnoses and all orders for this visit:  Dizziness Cardiology follow up  Palpitations Cardiology follow up  Essential hypertension He is aware his carvedilol dosage is 25 mg BID and no longer 12.5 mg BID. Continue all antihypertensives as prescribed.  Remember to bring in your blood pressure log with you for your follow up appointment.  DASH/Mediterranean Diets are healthier choices for HTN.      Follow Up Instructions Return in about 6 weeks (around 03/07/2019).     I discussed the assessment and treatment plan with the patient. The patient was provided an opportunity to ask questions and all were answered. The patient agreed with the plan and demonstrated an understanding of the instructions.   The patient was advised to call back or seek an in-person evaluation if the symptoms worsen or if the condition fails to improve as anticipated.  I provided 24 minutes of non-face-to-face time during this encounter including median intraservice time, reviewing previous notes, labs, imaging, medications and explaining diagnosis and  management.  Zachary RiggZelda W Tylique Aull, FNP-BC

## 2019-01-25 ENCOUNTER — Telehealth: Payer: Self-pay

## 2019-01-25 NOTE — Telephone Encounter (Signed)
Spoke to patient's wife 8/19 advised patient has treadmill test scheduled 02/06/19 at 11:00 am at [facility] office.Advised patient will need covid test done at [facility] location 02/02/19.Advised after covid test he will need to quarantine until after treadmill.

## 2019-01-30 ENCOUNTER — Other Ambulatory Visit (HOSPITAL_COMMUNITY): Payer: BC Managed Care – PPO

## 2019-02-01 ENCOUNTER — Telehealth (HOSPITAL_COMMUNITY): Payer: Self-pay

## 2019-02-01 NOTE — Telephone Encounter (Signed)
Encounter complete. 

## 2019-02-06 ENCOUNTER — Inpatient Hospital Stay (HOSPITAL_COMMUNITY): Admission: RE | Admit: 2019-02-06 | Payer: BC Managed Care – PPO | Source: Ambulatory Visit

## 2019-02-06 ENCOUNTER — Other Ambulatory Visit: Payer: Self-pay | Admitting: *Deleted

## 2019-02-06 ENCOUNTER — Telehealth: Payer: Self-pay | Admitting: *Deleted

## 2019-02-06 NOTE — Telephone Encounter (Signed)
Spoke to patient aware needing the covid test on 02/23/19  The GXT HAS BEEN SCHEDULE FOR 02/27/19 - patient received a call from nuclear dept changing gxt to 02/27/19 from 02/20/19  Patient and girlfriend aware of changes and to self isolate until gxt.  follow up appointment reschedule for Dr Audie Box is 03/12/19 at 3:40 pm Patient verbalized understanding

## 2019-02-06 NOTE — Telephone Encounter (Signed)
-----   Message from Marcine Matar sent at 02/06/2019  8:24 AM EDT ----- Regarding: COVID test Good Day,   There is a GXT scheduled on February 20, 2019, @ at 3:30 pm patient of Dr. Audie Box name Zachary Campos.    Please call the patient and schedule/order COVID testing at [facility] the patient is aware the nurse will be calling to set up COVID testing and self-isolate after the COVID testing is performed until her appointment.    If the patient does not have the COVID testing performed and if we don't have the testing results back the stress echo appointment, we will have to cancel the test.  Jari Sportsman

## 2019-02-07 ENCOUNTER — Other Ambulatory Visit: Payer: Self-pay

## 2019-02-07 ENCOUNTER — Other Ambulatory Visit: Payer: BC Managed Care – PPO

## 2019-02-07 DIAGNOSIS — E785 Hyperlipidemia, unspecified: Secondary | ICD-10-CM

## 2019-02-07 DIAGNOSIS — I10 Essential (primary) hypertension: Secondary | ICD-10-CM

## 2019-02-07 DIAGNOSIS — E119 Type 2 diabetes mellitus without complications: Secondary | ICD-10-CM

## 2019-02-07 NOTE — Progress Notes (Signed)
Patient here for fasting labs. 

## 2019-02-08 LAB — COMPREHENSIVE METABOLIC PANEL
ALT: 54 IU/L — ABNORMAL HIGH (ref 0–44)
AST: 93 IU/L — ABNORMAL HIGH (ref 0–40)
Albumin/Globulin Ratio: 1.4 (ref 1.2–2.2)
Albumin: 4 g/dL (ref 3.8–4.9)
Alkaline Phosphatase: 76 IU/L (ref 39–117)
BUN/Creatinine Ratio: 12 (ref 9–20)
BUN: 14 mg/dL (ref 6–24)
Bilirubin Total: 0.3 mg/dL (ref 0.0–1.2)
CO2: 20 mmol/L (ref 20–29)
Calcium: 9.1 mg/dL (ref 8.7–10.2)
Chloride: 99 mmol/L (ref 96–106)
Creatinine, Ser: 1.13 mg/dL (ref 0.76–1.27)
GFR calc Af Amer: 85 mL/min/{1.73_m2} (ref >59)
GFR calc non Af Amer: 73 mL/min/{1.73_m2} (ref >59)
Globulin, Total: 2.9 g/dL (ref 1.5–4.5)
Glucose: 225 mg/dL — ABNORMAL HIGH (ref 65–99)
Potassium: 4.1 mmol/L (ref 3.5–5.2)
Sodium: 136 mmol/L (ref 134–144)
Total Protein: 6.9 g/dL (ref 6.0–8.5)

## 2019-02-08 LAB — LIPID PANEL
Chol/HDL Ratio: 6.3 ratio — ABNORMAL HIGH (ref 0.0–5.0)
Cholesterol, Total: 228 mg/dL — ABNORMAL HIGH (ref 100–199)
HDL: 36 mg/dL — ABNORMAL LOW (ref >39)
LDL Chol Calc (NIH): 101 mg/dL — ABNORMAL HIGH (ref 0–99)
Triglycerides: 538 mg/dL — ABNORMAL HIGH (ref 0–149)
VLDL Cholesterol Cal: 91 mg/dL — ABNORMAL HIGH (ref 5–40)

## 2019-02-08 LAB — HEMOGLOBIN A1C
Est. average glucose Bld gHb Est-mCnc: 169 mg/dL
Hgb A1c MFr Bld: 7.5 % — ABNORMAL HIGH (ref 4.8–5.6)

## 2019-02-12 ENCOUNTER — Other Ambulatory Visit: Payer: Self-pay | Admitting: Nurse Practitioner

## 2019-02-12 MED ORDER — GLIMEPIRIDE 4 MG PO TABS: 4.0000 mg | ORAL_TABLET | Freq: Every day | ORAL | 3 refills | Status: DC

## 2019-02-13 ENCOUNTER — Ambulatory Visit (HOSPITAL_COMMUNITY): Payer: BC Managed Care – PPO | Attending: Cardiovascular Disease

## 2019-02-13 ENCOUNTER — Telehealth: Payer: Self-pay | Admitting: Family Medicine

## 2019-02-13 ENCOUNTER — Other Ambulatory Visit: Payer: Self-pay

## 2019-02-13 DIAGNOSIS — R002 Palpitations: Secondary | ICD-10-CM | POA: Diagnosis present

## 2019-02-13 DIAGNOSIS — E782 Mixed hyperlipidemia: Secondary | ICD-10-CM | POA: Insufficient documentation

## 2019-02-13 DIAGNOSIS — R42 Dizziness and giddiness: Secondary | ICD-10-CM | POA: Insufficient documentation

## 2019-02-13 DIAGNOSIS — Z720 Tobacco use: Secondary | ICD-10-CM | POA: Insufficient documentation

## 2019-02-13 DIAGNOSIS — R0789 Other chest pain: Secondary | ICD-10-CM | POA: Insufficient documentation

## 2019-02-13 DIAGNOSIS — R079 Chest pain, unspecified: Secondary | ICD-10-CM

## 2019-02-13 NOTE — Telephone Encounter (Signed)
Patient requesting lab results

## 2019-02-16 ENCOUNTER — Ambulatory Visit: Payer: BC Managed Care – PPO | Admitting: Cardiovascular Disease

## 2019-02-20 ENCOUNTER — Encounter (HOSPITAL_COMMUNITY): Payer: BC Managed Care – PPO

## 2019-02-22 ENCOUNTER — Telehealth (HOSPITAL_COMMUNITY): Payer: Self-pay

## 2019-02-22 NOTE — Telephone Encounter (Signed)
Encounter complete. 

## 2019-02-23 ENCOUNTER — Other Ambulatory Visit (HOSPITAL_COMMUNITY)
Admission: RE | Admit: 2019-02-23 | Discharge: 2019-02-23 | Disposition: A | Discharge status: Home / Self Care | Payer: BC Managed Care – PPO | Source: Ambulatory Visit | Attending: Cardiovascular Disease | Admitting: Cardiovascular Disease

## 2019-02-23 DIAGNOSIS — Z01812 Encounter for preprocedural laboratory examination: Secondary | ICD-10-CM | POA: Insufficient documentation

## 2019-02-23 DIAGNOSIS — Z20828 Contact with and (suspected) exposure to other viral communicable diseases: Secondary | ICD-10-CM | POA: Insufficient documentation

## 2019-02-24 LAB — NOVEL CORONAVIRUS, NAA (HOSP ORDER, SEND-OUT TO REF LAB; TAT 18-24 HRS): SARS-CoV-2, NAA: NOT DETECTED

## 2019-02-27 ENCOUNTER — Other Ambulatory Visit: Payer: Self-pay

## 2019-02-27 ENCOUNTER — Ambulatory Visit (HOSPITAL_COMMUNITY)
Admission: RE | Admit: 2019-02-27 | Discharge: 2019-02-27 | Disposition: A | Discharge status: Home / Self Care | Payer: BC Managed Care – PPO | Source: Ambulatory Visit | Attending: Cardiovascular Disease | Admitting: Cardiovascular Disease

## 2019-02-27 DIAGNOSIS — R002 Palpitations: Secondary | ICD-10-CM | POA: Diagnosis present

## 2019-02-27 DIAGNOSIS — R0789 Other chest pain: Secondary | ICD-10-CM

## 2019-02-27 DIAGNOSIS — R42 Dizziness and giddiness: Secondary | ICD-10-CM | POA: Insufficient documentation

## 2019-02-27 DIAGNOSIS — E782 Mixed hyperlipidemia: Secondary | ICD-10-CM | POA: Diagnosis present

## 2019-02-27 DIAGNOSIS — Z720 Tobacco use: Secondary | ICD-10-CM | POA: Insufficient documentation

## 2019-02-27 DIAGNOSIS — R079 Chest pain, unspecified: Secondary | ICD-10-CM

## 2019-02-28 LAB — EXERCISE TOLERANCE TEST
Estimated workload: 7.8 METS
Exercise duration (min): 6 min
Exercise duration (sec): 32 s
MPHR: 166 {beats}/min
Peak HR: 136 {beats}/min
Percent HR: 81 %
RPE: 18
Rest HR: 96 {beats}/min

## 2019-03-11 NOTE — Progress Notes (Signed)
Cardiology Office Note:   Date:  03/12/2019  NAME:  Zachary Campos    MRN: [id number] DOB:  1965-11-25   PCP:  Scot Jun, FNP  Cardiologist:  Evalina Field, MD  Electrophysiologist:  None   Referring MD: Scot Jun, FNP   Chief Complaint  Patient presents with  . Chest Pain   History of Present Illness:   Zachary Campos is a 53 y.o. male with a hx of hypertension, diabetes, tobacco abuse, hyperlipidemia, tobacco abuse who presents for follow-up of chest pain.  He was seen 2 months ago for the evaluation of chest pain and shortness of breath with exertion.  He underwent an echocardiogram which was normal, and had a exercise treadmill stress test that was nondiagnostic.  However, he did not have any concerning ischemic changes.  He reports he is still getting episodes of exertional chest pain or shortness of breath that occur 3 times weekly.  He also reports intermittent palpitations with this.  Associated symptoms also include dizziness.  His blood pressure today is 147/100.  He reports no symptoms in office today.  Review of most recent lipid profile shows LDL 162 and triglycerides severely elevated at 538.  Most recent A1c 7.5.  He is still smoking but is down to half pack per day.  He reports he is encouraged to quit smoking.  Past Medical History: Past Medical History:  Diagnosis Date  . Epistaxis 08/07/2012  . Hypercholesterolemia 08/08/2012   Lipid panel 08/07/2012: Cholesterol 245, triglycerides 287, HDL 46, LDL 142 (10-yr CVD risk 9.7%).   . Hypertension     Past Surgical History: History reviewed. No pertinent surgical history.  Current Medications: Current Meds  Medication Sig  . Accu-Chek FastClix Lancets MISC   . amLODipine (NORVASC) 10 MG tablet Take 1 tablet (10 mg total) by mouth daily.  Marland Kitchen atorvastatin (LIPITOR) 40 MG tablet Take 1 tablet (40 mg total) by mouth daily at 6 PM.  . blood glucose meter kit and supplies Dispense based on patient and  insurance preference. Use up to twice daily as directed. (FOR ICD-10 E10.9, E11.9).  . carvedilol (COREG) 25 MG tablet Take 1 tablet (25 mg total) by mouth 2 (two) times daily.  Marland Kitchen glimepiride (AMARYL) 4 MG tablet Take 1 tablet (4 mg total) by mouth daily before breakfast.  . ipratropium (ATROVENT) 0.03 % nasal spray Place 2 sprays into both nostrils 2 (two) times daily.  . metFORMIN (GLUCOPHAGE) 1000 MG tablet Take 1 tablet (1,000 mg total) by mouth 2 (two) times daily with a meal.     Allergies:    Patient has no known allergies.   Social History: Social History   Socioeconomic History  . Marital status: Married    Spouse name: Not on file  . Number of children: 1  . Years of education: Not on file  . Highest education level: Not on file  Occupational History  . Occupation: Fish farm manager  . Financial resource strain: Not on file  . Food insecurity    Worry: Not on file    Inability: Not on file  . Transportation needs    Medical: Not on file    Non-medical: Not on file  Tobacco Use  . Smoking status: Current Every Day Smoker    Packs/day: 0.50    Years: 35.00    Pack years: 17.50    Types: Cigarettes  . Smokeless tobacco: Never Used  Substance and Sexual Activity  . Alcohol use:  Yes    Comment: averages 1 40oz beer every evening  . Drug use: No  . Sexual activity: Not on file  Lifestyle  . Physical activity    Days per week: Not on file    Minutes per session: Not on file  . Stress: Not on file  Relationships  . Social Herbalist on phone: Not on file    Gets together: Not on file    Attends religious service: Not on file    Active member of club or organization: Not on file    Attends meetings of clubs or organizations: Not on file    Relationship status: Not on file  Other Topics Concern  . Not on file  Social History Narrative   Works at a Agricultural [employer].     Family History: The patient's family history includes  Bladder Cancer in his mother; Hyperlipidemia in his mother; Hypertension in his mother. There is no history of Heart disease, Stroke, or Diabetes.  ROS:   All other ROS reviewed and negative. Pertinent positives noted in the HPI.     EKGs/Labs/Other Studies Reviewed:   The following studies were personally reviewed by me today:  ETT 02/28/2019  There was no ST segment deviation noted during stress.  No T wave inversion was noted during stress.  No evidence of ischemia, but did not reach target HR (reached 81% max age-predicted HR)   TTE 02/13/2019  1. The left ventricle has normal systolic function with an ejection fraction of 60-65%. The cavity size was normal. Left ventricular diastolic Doppler parameters are consistent with impaired relaxation.  2. The right ventricle has normal systolic function. The cavity was normal. There is no increase in right ventricular wall thickness.  3. No evidence of mitral valve stenosis.  4. No stenosis of the aortic valve.  5. The aorta is normal unless otherwise noted.  6. The aortic root and ascending aorta are normal in size and structure.  7. The atrial septum is grossly normal.  Recent Labs: 08/07/2018: Hemoglobin 14.0; Platelets 135; TSH 0.840 02/07/2019: ALT 54; BUN 14; Creatinine, Ser 1.13; Potassium 4.1; Sodium 136   Recent Lipid Panel    Component Value Date/Time   CHOL 228 (H) 02/07/2019 0856   TRIG 538 (H) 02/07/2019 0856   HDL 36 (L) 02/07/2019 0856   CHOLHDL 6.3 (H) 02/07/2019 0856   CHOLHDL 4.5 07/06/2018 0755   VLDL 40 07/06/2018 0755   LDLCALC 101 (H) 02/07/2019 0856    Physical Exam:   VS:  BP (!) 147/100   Pulse 88   Ht '5\' 11"'$  (1.803 m)   Wt 199 lb (90.3 kg)   SpO2 95%   BMI 27.75 kg/m    Wt Readings from Last 3 Encounters:  03/12/19 199 lb (90.3 kg)  01/22/19 198 lb 9.6 oz (90.1 kg)  12/19/18 197 lb (89.4 kg)    General: Well nourished, well developed, in no acute distress Heart: Atraumatic, normal size  Eyes:  PEERLA, EOMI  Neck: Supple, no JVD Endocrine: No thryomegaly Cardiac: Normal S1, S2; RRR; no murmurs, rubs, or gallops Lungs: Clear to auscultation bilaterally, no wheezing, rhonchi or rales  Abd: Soft, nontender, no hepatomegaly  Ext: No edema, pulses 2+ Musculoskeletal: No deformities, BUE and BLE strength normal and equal Skin: Warm and dry, no rashes   Neuro: Alert and oriented to person, place, time, and situation, CNII-XII grossly intact, no focal deficits  Psych: Normal mood and affect   ASSESSMENT:  Glenice Bow is a 53 y.o. male who presents for the following: 1. Chest pain of uncertain etiology   2. SOB (shortness of breath) on exertion   3. Palpitations   4. Essential hypertension   5. Tobacco abuse   6. Hyperlipidemia, unspecified hyperlipidemia type     PLAN:   1. Chest pain of uncertain etiology 2. SOB (shortness of breath) on exertion 3. Palpitations -Recent nondiagnostic exercise treadmill stress test; we will proceed with a Lexiscan nuclear medicine perfusion imaging study -No symptoms of heart failure on examination and most recent echocardiogram with grade 1 diastolic dysfunction  4. Essential hypertension -Blood pressure not at goal today, we will add losartan 50 mg daily -We will recheck a BMP in 2 weeks  5. Tobacco abuse -Counseled on smoking cessation today  6. Hyperlipidemia, unspecified hyperlipidemia type -Most recent lipid profile was abnormal with total cholesterol 228, triglycerides 538, LDL 162 -We recently restarted Lipitor 40 mg -We will recheck a cholesterol profile in 2 weeks on a fasting state -Counseled the importance of reducing dietary fat intake to reduce triglycerides   Disposition: Return in about 3 months (around 06/12/2019).  Medication Adjustments/Labs and Tests Ordered: Current medicines are reviewed at length with the patient today.  Concerns regarding medicines are outlined above.  Orders Placed This Encounter  Procedures  .  Lipid panel  . Comprehensive metabolic panel  . MYOCARDIAL PERFUSION IMAGING   Meds ordered this encounter  Medications  . losartan (COZAAR) 50 MG tablet    Sig: Take 1 tablet (50 mg total) by mouth daily.    Dispense:  90 tablet    Refill:  3    Patient Instructions  Medication Instructions:  START TAKING  LOSARTAN 50 MG DAILY   CONTINUE ALL OTHER MEDICATIONS   If you need a refill on your cardiac medications before your next appointment, please call your pharmacy.   Lab work:  NOT NEEDED   .  Testing/Procedures: Will be schedule at [place] has requested that you have a lexiscan myoview. For further information please visit [email or web address]. Please follow instruction sheet, as given.   Follow-Up: At [facility], you and your health needs are our priority.  As part of our continuing mission to provide you with exceptional heart care, we have created designated Provider Care Teams.  These Care Teams include your primary Cardiologist (physician) and Advanced Practice Providers (APPs -  Physician Assistants and Nurse Practitioners) who all work together to provide you with the care you need, when you need it. . Dr Audie Box recommends that you schedule a follow-up appointment in 3 month visit      Any Other Special Instructions Will Be Listed Below (If Applicable).    Signed, Addison Naegeli. Audie Box, [place]  [address], [place],  [address] [phone]  03/12/2019 3:49 PM

## 2019-03-12 ENCOUNTER — Ambulatory Visit (INDEPENDENT_AMBULATORY_CARE_PROVIDER_SITE_OTHER): Payer: BC Managed Care – PPO | Admitting: Cardiovascular Disease

## 2019-03-12 ENCOUNTER — Other Ambulatory Visit: Payer: Self-pay

## 2019-03-12 ENCOUNTER — Encounter: Payer: Self-pay | Admitting: Cardiovascular Disease

## 2019-03-12 VITALS — BP 147/100 | HR 88 | Ht 71.0 in | Wt 199.0 lb

## 2019-03-12 DIAGNOSIS — R002 Palpitations: Secondary | ICD-10-CM

## 2019-03-12 DIAGNOSIS — R0602 Shortness of breath: Secondary | ICD-10-CM | POA: Diagnosis not present

## 2019-03-12 DIAGNOSIS — R079 Chest pain, unspecified: Secondary | ICD-10-CM | POA: Diagnosis not present

## 2019-03-12 DIAGNOSIS — I10 Essential (primary) hypertension: Secondary | ICD-10-CM | POA: Diagnosis not present

## 2019-03-12 DIAGNOSIS — Z720 Tobacco use: Secondary | ICD-10-CM

## 2019-03-12 DIAGNOSIS — E785 Hyperlipidemia, unspecified: Secondary | ICD-10-CM

## 2019-03-12 MED ORDER — LOSARTAN POTASSIUM 50 MG PO TABS: 50.0000 mg | ORAL_TABLET | Freq: Every day | ORAL | 3 refills | Status: DC

## 2019-03-12 NOTE — Patient Instructions (Addendum)
Medication Instructions:  START TAKING  LOSARTAN 50 MG DAILY   CONTINUE ALL OTHER MEDICATIONS   If you need a refill on your cardiac medications before your next appointment, please call your pharmacy.   Lab work:  NOT NEEDED   .  Testing/Procedures: Will be schedule at [place] has requested that you have a lexiscan myoview. For further information please visit [email or web address]. Please follow instruction sheet, as given.   Follow-Up: At [facility], you and your health needs are our priority.  As part of our continuing mission to provide you with exceptional heart care, we have created designated Provider Care Teams.  These Care Teams include your primary Cardiologist (physician) and Advanced Practice Providers (APPs -  Physician Assistants and Nurse Practitioners) who all work together to provide you with the care you need, when you need it. . Dr Audie Box recommends that you schedule a follow-up appointment in 3 month visit      Any Other Special Instructions Will Be Listed Below (If Applicable).

## 2019-03-13 ENCOUNTER — Telehealth: Payer: Self-pay

## 2019-03-13 ENCOUNTER — Ambulatory Visit: Payer: BC Managed Care – PPO

## 2019-03-13 NOTE — Telephone Encounter (Signed)

## 2019-03-14 ENCOUNTER — Ambulatory Visit (INDEPENDENT_AMBULATORY_CARE_PROVIDER_SITE_OTHER): Payer: BC Managed Care – PPO | Admitting: Nurse Practitioner

## 2019-03-14 ENCOUNTER — Other Ambulatory Visit: Payer: Self-pay

## 2019-03-14 VITALS — BP 173/119 | HR 78 | Temp 97.3°F | Resp 17 | Wt 198.4 lb

## 2019-03-14 DIAGNOSIS — Z122 Encounter for screening for malignant neoplasm of respiratory organs: Secondary | ICD-10-CM

## 2019-03-14 DIAGNOSIS — Z125 Encounter for screening for malignant neoplasm of prostate: Secondary | ICD-10-CM

## 2019-03-14 DIAGNOSIS — Z23 Encounter for immunization: Secondary | ICD-10-CM

## 2019-03-14 DIAGNOSIS — R7989 Other specified abnormal findings of blood chemistry: Secondary | ICD-10-CM

## 2019-03-14 DIAGNOSIS — I10 Essential (primary) hypertension: Secondary | ICD-10-CM

## 2019-03-14 DIAGNOSIS — E782 Mixed hyperlipidemia: Secondary | ICD-10-CM | POA: Diagnosis not present

## 2019-03-14 DIAGNOSIS — E1165 Type 2 diabetes mellitus with hyperglycemia: Secondary | ICD-10-CM

## 2019-03-14 DIAGNOSIS — Z1211 Encounter for screening for malignant neoplasm of colon: Secondary | ICD-10-CM

## 2019-03-14 LAB — GLUCOSE, POCT (MANUAL RESULT ENTRY): POC Glucose: 181 mg/dl — AB (ref 70–99)

## 2019-03-14 MED ORDER — GLUCOSE BLOOD VI STRP: ORAL_STRIP | 12 refills | Status: DC

## 2019-03-14 MED ORDER — ONETOUCH DELICA LANCING DEV MISC: 99 refills | Status: DC

## 2019-03-14 MED ORDER — ONETOUCH DELICA LANCETS 30G MISC: 1.0000 | Freq: Two times a day (BID) | 99 refills | Status: AC

## 2019-03-14 MED ORDER — ONETOUCH VERIO W/DEVICE KIT: PACK | 0 refills | Status: DC

## 2019-03-14 NOTE — Progress Notes (Signed)
Doesn't check FSBS at home. Denies nausea, vomiting, numbness/tingling in feet, polydipsia, blurry vision.   Denies chest pain, SHOB, lower extremity swelling. Still has dizziness & occasional palpitations.  Was started on Losartan 50 mg 2 days ago but hasn't picked up Rx yet.  Is fasting today.  Will get flu shot today.

## 2019-03-14 NOTE — Progress Notes (Signed)
Assessment & Plan:  Zachary Campos was seen today for diabetes and hypertension.  Diagnoses and all orders for this visit:  Essential hypertension Continue all antihypertensives as prescribed.  Remember to bring in your blood pressure log with you for your follow up appointment.  DASH/Mediterranean Diets are healthier choices for HTN.    Type 2 diabetes mellitus with hyperglycemia, without long-term current use of insulin (HCC) -     Glucose (CBG) -     Lancet Devices (ONE TOUCH DELICA LANCING DEV) MISC; Use as instructed. Inject into the skin twice daily.  ICD-10 E10.9, E11.9 -     Blood Glucose Monitoring Suppl (ONETOUCH VERIO) w/Device KIT; Use as instructed. Inject into the skin once daily. -     OneTouch Delica Lancets 13K MISC; 1 applicator by Subdermal route 2 (two) times daily. ICD-10 E10.9, E11.9 -     glucose blood test strip; Use as instructed. Inject into the skin twice daily.  ICD-10 E10.9, E11.9 Continue blood sugar control as discussed in office today, low carbohydrate diet, and regular physical exercise as tolerated, 150 minutes per week (30 min each day, 5 days per week, or 50 min 3 days per week). Keep blood sugar logs with fasting goal of 90-130 mg/dl, post prandial (after you eat) less than 180.  For Hypoglycemia: BS <60 and Hyperglycemia BS >400; contact the clinic ASAP. Annual eye exams and foot exams are recommended.   Mixed hyperlipidemia -     Lipid panel INSTRUCTIONS: Work on a low fat, heart healthy diet and participate in regular aerobic exercise program by working out at least 150 minutes per week; 5 days a week-30 minutes per day. Avoid red meat, fried foods. junk foods, sodas, sugary drinks, unhealthy snacking, alcohol and smoking.  Drink at least 48oz of water per day and monitor your carbohydrate intake daily.    Needs flu shot -     Flu Vaccine QUAD 6+ mos PF IM (Fluarix Quad PF)  Colon cancer screening -     Fecal occult blood, imunochemical  Elevated  liver function tests -     Hepatic Function Panel -     Gamma GT  Prostate cancer screening -     PSA  Encounter for screening for lung cancer -     CT CHEST LUNG CA SCREEN LOW DOSE W/O CM; Future    Patient has been counseled on age-appropriate routine health concerns for screening and prevention. These are reviewed and up-to-date. Referrals have been placed accordingly. Immunizations are up-to-date or declined.    Subjective:   Chief Complaint  Patient presents with  . Diabetes  . Hypertension   HPI Zachary Campos 53 y.o. male presents to office today for DM HTN HPL.  has a past medical history of Epistaxis (08/07/2012), Hypercholesterolemia (08/08/2012), and Hypertension.  He is seeing Cardiology for CP, [facility] with exertion and dizziness. ECHO normal and exercise stress test non diagnostic. He is requesting additional time off from work for his current symptoms however he is also still smoking, not taking his blood pressure medications or diabetes medications as prescribed and with dietary noncompliance. I have instructed him that I feel although he may not need to climb ladders at work due to his dizziness his just should be able to accommodate him with other safer work duties.  It is difficult to say he needs to be out of work for health concerns when he is non compliant with his medications.    Essential Hypertension STILL  smoking. States " On my way to quitting".  Down to 1/2 ppd from 1.5 ppd. Smoking since the age of 57. Chest CT for lung cancer screening ordered today. He has 3 antihypertensives and has only taken 1 today. Did not take carvedilol and has not picked up losartan from the pharmacy which was ordered 2 days ago.  BP Readings from Last 3 Encounters:  03/14/19 (!) 173/119  03/12/19 (!) 147/100  01/22/19 (!) 162/100   Hyperlipidemia Patient presents for follow up to hyperlipidemia with hypertriglyceridemia.  He is not medication compliant with taking atorvastatin 66m.  He is not diet compliant. Denies statin intolerance including myalgias. Drinking beer on the weekends. LFTs are elevated. However he has not been taking his lipitor so I do not believe this is related to statin.  Lab Results  Component Value Date   CHOL 228 (H) 02/07/2019   Lab Results  Component Value Date   HDL 36 (L) 02/07/2019   Lab Results  Component Value Date   LDLCALC 101 (H) 02/07/2019   Lab Results  Component Value Date   TRIG 538 (H) 02/07/2019   Lab Results  Component Value Date   CHOLHDL 6.3 (H) 02/07/2019     DM TYPE 2 He is not checking his blood sugars. States his pharmacy told him they didn't have his meter however his meter was ordered several months ago by his PCP. He is not diet compliant and also not taking his glucose lowering medications as prescribed. States he took one pill for his diabetes this morning but can not recall which one. I instructed him that he has 2 pills he should be taking in the morning for his diabetes and he should make sure he knows the names of his medications and what he is taking them for. Current medications metformin 1000 mg BID and glimepiride 4 mg daily.  Lab Results  Component Value Date   HGBA1C 7.5 (H) 02/07/2019    Review of Systems  Constitutional: Negative for fever, malaise/fatigue and weight loss.  HENT: Negative.  Negative for nosebleeds.   Eyes: Negative.  Negative for blurred vision, double vision and photophobia.  Respiratory: Positive for shortness of breath. Negative for cough.   Cardiovascular: Positive for chest pain. Negative for palpitations and leg swelling.  Gastrointestinal: Negative.  Negative for heartburn, nausea and vomiting.  Musculoskeletal: Negative.  Negative for myalgias.  Neurological: Positive for dizziness. Negative for focal weakness, seizures and headaches.  Psychiatric/Behavioral: Negative.  Negative for suicidal ideas.    Past Medical History:  Diagnosis Date  . Epistaxis 08/07/2012  .  Hypercholesterolemia 08/08/2012   Lipid panel 08/07/2012: Cholesterol 245, triglycerides 287, HDL 46, LDL 142 (10-yr CVD risk 9.7%).   . Hypertension     History reviewed. No pertinent surgical history.  Family History  Problem Relation Age of Onset  . Bladder Cancer Mother   . Hypertension Mother   . Hyperlipidemia Mother   . Heart disease Neg Hx   . Stroke Neg Hx   . Diabetes Neg Hx     Social History Reviewed with no changes to be made today.   Outpatient Medications Prior to Visit  Medication Sig Dispense Refill  . amLODipine (NORVASC) 10 MG tablet Take 1 tablet (10 mg total) by mouth daily. 30 tablet 3  . atorvastatin (LIPITOR) 40 MG tablet Take 1 tablet (40 mg total) by mouth daily at 6 PM. 90 tablet 1  . carvedilol (COREG) 25 MG tablet Take 1 tablet (25 mg total)  by mouth 2 (two) times daily. 60 tablet 6  . glimepiride (AMARYL) 4 MG tablet Take 1 tablet (4 mg total) by mouth daily before breakfast. 30 tablet 3  . ipratropium (ATROVENT) 0.03 % nasal spray Place 2 sprays into both nostrils 2 (two) times daily. 30 mL 0  . metFORMIN (GLUCOPHAGE) 1000 MG tablet Take 1 tablet (1,000 mg total) by mouth 2 (two) times daily with a meal. 180 tablet 1  . losartan (COZAAR) 50 MG tablet Take 1 tablet (50 mg total) by mouth daily. (Patient not taking: Reported on 03/14/2019) 90 tablet 3  . Accu-Chek FastClix Lancets MISC     . blood glucose meter kit and supplies Dispense based on patient and insurance preference. Use up to twice daily as directed. (FOR ICD-10 E10.9, E11.9). 1 each 0   No facility-administered medications prior to visit.     No Known Allergies     Objective:    BP (!) 173/119   Pulse 78   Temp (!) 97.3 F (36.3 C) (Temporal)   Resp 17   Wt 198 lb 6.4 oz (90 kg)   SpO2 94%   BMI 27.67 kg/m  Wt Readings from Last 3 Encounters:  03/14/19 198 lb 6.4 oz (90 kg)  03/12/19 199 lb (90.3 kg)  01/22/19 198 lb 9.6 oz (90.1 kg)    Physical Exam Vitals signs and  nursing note reviewed.  Constitutional:      Appearance: He is well-developed.  HENT:     Head: Normocephalic and atraumatic.  Neck:     Musculoskeletal: Normal range of motion.  Cardiovascular:     Rate and Rhythm: Normal rate and regular rhythm.     Heart sounds: Normal heart sounds. No murmur. No friction rub. No gallop.   Pulmonary:     Effort: Pulmonary effort is normal. No tachypnea or respiratory distress.     Breath sounds: Normal breath sounds. No decreased breath sounds, wheezing, rhonchi or rales.  Chest:     Chest wall: No tenderness.  Abdominal:     General: Bowel sounds are normal.     Palpations: Abdomen is soft.  Musculoskeletal: Normal range of motion.  Skin:    General: Skin is warm and dry.  Neurological:     Mental Status: He is alert and oriented to person, place, and time.     Coordination: Coordination normal.  Psychiatric:        Behavior: Behavior normal. Behavior is cooperative.        Thought Content: Thought content normal.        Judgment: Judgment normal.        Patient has been counseled extensively about nutrition and exercise as well as the importance of adherence with medications and regular follow-up. The patient was given clear instructions to go to ER or return to medical center if symptoms don't improve, worsen or new problems develop. The patient verbalized understanding.   Follow-up: Return in about 3 weeks (around 04/04/2019) for BP recheck.   Gildardo Pounds, FNP-BC [facility], [place]   03/14/2019, 12:51 PM

## 2019-03-16 LAB — HEPATIC FUNCTION PANEL
ALT: 31 IU/L (ref 0–44)
AST: 42 IU/L — ABNORMAL HIGH (ref 0–40)
Albumin: 4.2 g/dL (ref 3.8–4.9)
Alkaline Phosphatase: 58 IU/L (ref 39–117)
Bilirubin Total: 0.2 mg/dL (ref 0.0–1.2)
Bilirubin, Direct: 0.08 mg/dL (ref 0.00–0.40)
Total Protein: 7.1 g/dL (ref 6.0–8.5)

## 2019-03-16 LAB — PSA: Prostate Specific Ag, Serum: 2.2 ng/mL (ref 0.0–4.0)

## 2019-03-16 LAB — LIPID PANEL
Chol/HDL Ratio: 5 ratio (ref 0.0–5.0)
Cholesterol, Total: 263 mg/dL — ABNORMAL HIGH (ref 100–199)
HDL: 53 mg/dL (ref >39)
LDL Chol Calc (NIH): 168 mg/dL — ABNORMAL HIGH (ref 0–99)
Triglycerides: 226 mg/dL — ABNORMAL HIGH (ref 0–149)
VLDL Cholesterol Cal: 42 mg/dL — ABNORMAL HIGH (ref 5–40)

## 2019-03-16 LAB — SPECIMEN STATUS REPORT

## 2019-03-16 LAB — GAMMA GT: GGT: 121 IU/L — ABNORMAL HIGH (ref 0–65)

## 2019-03-19 ENCOUNTER — Telehealth (HOSPITAL_COMMUNITY): Payer: Self-pay | Admitting: Radiology

## 2019-03-19 NOTE — Telephone Encounter (Signed)
Patient given detailed instructions per Myocardial Perfusion Study Information Sheet for the test on 03/23/2019 at 8:00. Patient notified to arrive 15 minutes early and that it is imperative to arrive on time for appointment to keep from having the test rescheduled.  If you need to cancel or reschedule your appointment, please call the office within 24 hours of your appointment. . Patient verbalized understanding.EHK

## 2019-03-21 ENCOUNTER — Other Ambulatory Visit: Payer: Self-pay | Admitting: Nurse Practitioner

## 2019-03-21 MED ORDER — MECLIZINE HCL 25 MG PO TABS: 25.0000 mg | ORAL_TABLET | Freq: Three times a day (TID) | ORAL | 1 refills | Status: DC | PRN

## 2019-03-21 NOTE — Progress Notes (Signed)
Mr. Munn brought a 45 form from the state for me to fill out today. I have instructed him that I can no longer continue to extend his leave past the date of original date of  03-26-2019. He is not compliant with his medications, continues to smoke cigarettes and drink alcohol and has made very little improvements in his overall health.  I explained to him that I can not continue to keep him out of work because he has not been compliant which is most likely causing his subjective symptoms. Pill counts have been off which indicates medication noncompliance.  Orthostatics have been negative. He is currently seeing cardiology and has Lexiscan nuc med perfusion imaging study pending. I have instructed him that if his cardiac work up is negative I do not have a reason to continue to keep him out of work. He has been instructed from his employer that he could apply for other positions with [employer] since he is not able to climb ladders.   Exercise tolerance test on 02-27-2019 was nondiagnostic as well as normal Echocardiogram. He continues to report episodes of exertional chest pain or shortness of breath that occur 3 times weekly.  He also reports intermittent palpitations with this.  Associated symptoms also include dizziness.  I am awaiting Cardiology tests at this time.

## 2019-03-22 ENCOUNTER — Encounter (HOSPITAL_COMMUNITY): Payer: BC Managed Care – PPO

## 2019-03-23 ENCOUNTER — Encounter (HOSPITAL_COMMUNITY): Payer: BC Managed Care – PPO

## 2019-03-27 ENCOUNTER — Telehealth (HOSPITAL_COMMUNITY): Payer: Self-pay

## 2019-03-27 NOTE — Telephone Encounter (Signed)
Instructions left on the patient's answering machine. Asked to call back with any qustions. S.Chayna Surratt EMTP

## 2019-03-29 ENCOUNTER — Encounter (HOSPITAL_COMMUNITY): Payer: BC Managed Care – PPO

## 2019-04-04 ENCOUNTER — Other Ambulatory Visit: Payer: Self-pay

## 2019-04-04 ENCOUNTER — Ambulatory Visit (HOSPITAL_COMMUNITY): Payer: BC Managed Care – PPO | Attending: Cardiology

## 2019-04-04 DIAGNOSIS — R0602 Shortness of breath: Secondary | ICD-10-CM | POA: Insufficient documentation

## 2019-04-04 DIAGNOSIS — R079 Chest pain, unspecified: Secondary | ICD-10-CM | POA: Diagnosis present

## 2019-04-04 LAB — MYOCARDIAL PERFUSION IMAGING
LV dias vol: 82 mL (ref 62–150)
LV sys vol: 39 mL
Peak HR: 105 {beats}/min
Rest HR: 67 {beats}/min
SDS: 2
SRS: 0
SSS: 2
TID: 1.13

## 2019-04-04 MED ORDER — REGADENOSON 0.4 MG/5ML IV SOLN
0.4000 mg | Freq: Once | INTRAVENOUS | Status: AC
  Administered 2019-04-04: 0.4 mg via INTRAVENOUS

## 2019-04-04 MED ORDER — TECHNETIUM TC 99M TETROFOSMIN IV KIT
31.6000 | PACK | Freq: Once | INTRAVENOUS | Status: AC | PRN
  Administered 2019-04-04: 31.6 via INTRAVENOUS
  Filled 2019-04-04: qty 32

## 2019-04-04 MED ORDER — TECHNETIUM TC 99M TETROFOSMIN IV KIT
9.8000 | PACK | Freq: Once | INTRAVENOUS | Status: AC | PRN
  Administered 2019-04-04: 9.8 via INTRAVENOUS
  Filled 2019-04-04: qty 10

## 2019-04-06 ENCOUNTER — Telehealth: Payer: Self-pay | Admitting: Cardiovascular Disease

## 2019-04-06 NOTE — Telephone Encounter (Signed)
Discussed with Mr. Eastridge that perfusion imaging was normal. Recent ETT with no exercise induced arrhythmias. Bps have been very elevated and I wonder if this is causing his symptoms. He will work on his BP with his PCP and I will see him back in a few months.   [place] T. Audie Box, [place]  [address], [place], [place] [address] [phone]  9:51 AM

## 2019-04-09 ENCOUNTER — Telehealth: Payer: Self-pay | Admitting: Family Medicine

## 2019-04-09 NOTE — Telephone Encounter (Signed)
Patient had an appt with you on 11/4 but you will not be here. Patient says he needed paperwork filled out before 11/10. Please contact patient.

## 2019-04-11 ENCOUNTER — Ambulatory Visit: Payer: BC Managed Care – PPO

## 2019-04-13 NOTE — Telephone Encounter (Signed)
I have already filled his paperwork out and it was given to the front desk for faxing.

## 2019-05-01 ENCOUNTER — Telehealth: Payer: Self-pay

## 2019-05-01 NOTE — Telephone Encounter (Signed)

## 2019-05-02 ENCOUNTER — Ambulatory Visit: Payer: BC Managed Care – PPO

## 2019-05-22 ENCOUNTER — Telehealth: Payer: Self-pay

## 2019-05-22 NOTE — Telephone Encounter (Signed)
Called patient to do their pre-visit COVID screening.  Call went to voicemail. Unable to do prescreening.  

## 2019-05-23 ENCOUNTER — Ambulatory Visit (INDEPENDENT_AMBULATORY_CARE_PROVIDER_SITE_OTHER): Payer: BC Managed Care – PPO | Admitting: Nurse Practitioner

## 2019-05-23 DIAGNOSIS — E1165 Type 2 diabetes mellitus with hyperglycemia: Secondary | ICD-10-CM | POA: Diagnosis not present

## 2019-05-23 DIAGNOSIS — I10 Essential (primary) hypertension: Secondary | ICD-10-CM | POA: Diagnosis not present

## 2019-05-23 DIAGNOSIS — E785 Hyperlipidemia, unspecified: Secondary | ICD-10-CM | POA: Diagnosis not present

## 2019-05-23 MED ORDER — GLIMEPIRIDE 4 MG PO TABS: 4.0000 mg | ORAL_TABLET | Freq: Every day | ORAL | 3 refills | Status: DC

## 2019-05-23 MED ORDER — CARVEDILOL 25 MG PO TABS: 25.0000 mg | ORAL_TABLET | Freq: Two times a day (BID) | ORAL | 6 refills | Status: DC

## 2019-05-23 MED ORDER — METFORMIN HCL 1000 MG PO TABS: 1000.0000 mg | ORAL_TABLET | Freq: Two times a day (BID) | ORAL | 1 refills | Status: DC

## 2019-05-23 MED ORDER — AMLODIPINE BESYLATE 10 MG PO TABS: 10.0000 mg | ORAL_TABLET | Freq: Every day | ORAL | 3 refills | Status: DC

## 2019-05-23 MED ORDER — MECLIZINE HCL 25 MG PO TABS: 25.0000 mg | ORAL_TABLET | Freq: Three times a day (TID) | ORAL | 1 refills | Status: DC | PRN

## 2019-05-23 MED ORDER — ATORVASTATIN CALCIUM 40 MG PO TABS: 40.0000 mg | ORAL_TABLET | Freq: Every day | ORAL | 1 refills | Status: DC

## 2019-05-23 NOTE — Progress Notes (Signed)
Virtual Visit via Telephone Note Due to national recommendations of social distancing due to [place] 19, telehealth visit is felt to be most appropriate for this patient at this time.  I discussed the limitations, risks, security and privacy concerns of performing an evaluation and management service by telephone and the availability of in person appointments. I also discussed with the patient that there may be a patient responsible charge related to this service. The patient expressed understanding and agreed to proceed.    I connected with Christ Kick on 05/23/19  at   3:50 PM EST  EDT by telephone and verified that I am speaking with the correct person using two identifiers.   Consent I discussed the limitations, risks, security and privacy concerns of performing an evaluation and management service by telephone and the availability of in person appointments. I also discussed with the patient that there may be a patient responsible charge related to this service. The patient expressed understanding and agreed to proceed.   Location of Patient: Private  Residence   Location of Provider: [place] and [employer] Office    Persons participating in Telemedicine visit: Geryl Rankins FNP-BC [place]    History of Present Illness: Telemedicine visit for: F/U  has a past medical history of Epistaxis (08/07/2012), Hypercholesterolemia (08/08/2012), and Hypertension.  Essential Hypertension Controlled. He is monitoring his blood pressure at home. Reports average HOME READINGS :130-140/80s. He endorses medication compliance taking: Amlodipine 10 mg daily, carvedilol 25 mg BID and losartan 50 mg daily. Denies chest pain, shortness of breath, palpitations, lightheadedness, dizziness, headaches or BLE edema.    DM TYPE 2 Not well controlled mostly due to diet. He endorses medication compliance taking glimepiride 4 mg daily and metformin 1000 mg BID. Denies any symptoms  of hypoglycemia. Does not routinely monitor his glucose levels at home.  Lab Results  Component Value Date   HGBA1C 7.5 (H) 02/07/2019   Dyslipidemia LDL not at goal <70. He endorses medication compliance taking atorvastatin 40 mg daily. Denies any statin myalgias.  Lab Results  Component Value Date   LDLCALC 168 (H) 03/14/2019   Past Medical History:  Diagnosis Date  . Epistaxis 08/07/2012  . Hypercholesterolemia 08/08/2012   Lipid panel 08/07/2012: Cholesterol 245, triglycerides 287, HDL 46, LDL 142 (10-yr CVD risk 9.7%).   . Hypertension     History reviewed. No pertinent surgical history.  Family History  Problem Relation Age of Onset  . Bladder Cancer Mother   . Hypertension Mother   . Hyperlipidemia Mother   . Heart disease Neg Hx   . Stroke Neg Hx   . Diabetes Neg Hx    - Social History   Socioeconomic History  . Marital status: Married    Spouse name: Not on file  . Number of children: 1  . Years of education: Not on file  . Highest education level: Not on file  Occupational History  . Occupation: Psychologist, educational  Tobacco Use  . Smoking status: Current Every Day Smoker    Packs/day: 0.50    Years: 35.00    Pack years: 17.50    Types: Cigarettes  . Smokeless tobacco: Never Used  Substance and Sexual Activity  . Alcohol use: Yes    Comment: averages 1 40oz beer every evening  . Drug use: No  . Sexual activity: Not on file  Other Topics Concern  . Not on file  Social History Narrative   Works at a [employer].  Social Determinants of Health   Financial Resource Strain:   . Difficulty of Paying Living Expenses: Not on file  Food Insecurity:   . Worried About Charity fundraiser in the Last Year: Not on file  . Ran Out of Food in the Last Year: Not on file  Transportation Needs:   . Lack of Transportation (Medical): Not on file  . Lack of Transportation (Non-Medical): Not on file  Physical Activity:   . Days of Exercise per Week: Not  on file  . Minutes of Exercise per Session: Not on file  Stress:   . Feeling of Stress : Not on file  Social Connections:   . Frequency of Communication with Friends and Family: Not on file  . Frequency of Social Gatherings with Friends and Family: Not on file  . Attends Religious Services: Not on file  . Active Member of Clubs or Organizations: Not on file  . Attends Archivist Meetings: Not on file  . Marital Status: Not on file     Observations/Objective: Awake, alert and oriented x 3   Review of Systems  Constitutional: Negative for fever, malaise/fatigue and weight loss.  HENT: Negative.  Negative for nosebleeds.   Eyes: Negative.  Negative for blurred vision, double vision and photophobia.  Respiratory: Negative.  Negative for cough and shortness of breath.   Cardiovascular: Negative.  Negative for chest pain, palpitations and leg swelling.  Gastrointestinal: Negative.  Negative for heartburn, nausea and vomiting.  Musculoskeletal: Negative.  Negative for myalgias.  Neurological: Negative.  Negative for dizziness, focal weakness, seizures and headaches.  Psychiatric/Behavioral: Negative.  Negative for suicidal ideas.    Assessment and Plan: Zachary Campos was seen today for hypertension.  Diagnoses and all orders for this visit:  Accelerated essential hypertension -     amLODipine (NORVASC) 10 MG tablet; Take 1 tablet (10 mg total) by mouth daily. -     carvedilol (COREG) 25 MG tablet; Take 1 tablet (25 mg total) by mouth 2 (two) times daily. -     meclizine (ANTIVERT) 25 MG tablet; Take 1 tablet (25 mg total) by mouth 3 (three) times daily as needed for dizziness. -     CMP14+EGFR; Future Continue all antihypertensives as prescribed.  Remember to bring in your blood pressure log with you for your follow up appointment.  DASH/Mediterranean Diets are healthier choices for HTN.    Hyperlipidemia, unspecified hyperlipidemia type -     atorvastatin (LIPITOR) 40 MG  tablet; Take 1 tablet (40 mg total) by mouth daily at 6 PM. -     Lipid Panel; Future INSTRUCTIONS: Work on a low fat, heart healthy diet and participate in regular aerobic exercise program by working out at least 150 minutes per week; 5 days a week-30 minutes per day. Avoid red meat/beef/steak,  fried foods. junk foods, sodas, sugary drinks, unhealthy snacking, alcohol and smoking.  Drink at least 80 oz of water per day and monitor your carbohydrate intake daily.    Type 2 diabetes mellitus with hyperglycemia, without long-term current use of insulin (HCC) -     glimepiride (AMARYL) 4 MG tablet; Take 1 tablet (4 mg total) by mouth daily before breakfast. -     metFORMIN (GLUCOPHAGE) 1000 MG tablet; Take 1 tablet (1,000 mg total) by mouth 2 (two) times daily with a meal. -     A1c; Future Continue blood sugar control as discussed in office today, low carbohydrate diet, and regular physical exercise as tolerated, 150 minutes  per week (30 min each day, 5 days per week, or 50 min 3 days per week). Keep blood sugar logs with fasting goal of 90-130 mg/dl, post prandial (after you eat) less than 180.  For Hypoglycemia: BS <60 and Hyperglycemia BS >400; contact the clinic ASAP. Annual eye exams and foot exams are recommended.    Follow Up Instructions Return in about 2 months (around 07/24/2019) for HPL/DM.     I discussed the assessment and treatment plan with the patient. The patient was provided an opportunity to ask questions and all were answered. The patient agreed with the plan and demonstrated an understanding of the instructions.   The patient was advised to call back or seek an in-person evaluation if the symptoms worsen or if the condition fails to improve as anticipated.  I provided 14 minutes of non-face-to-face time during this encounter including median intraservice time, reviewing previous notes, labs, imaging, medications and explaining diagnosis and management.  Gildardo Pounds,  FNP-BC

## 2019-06-20 ENCOUNTER — Telehealth: Payer: Self-pay | Admitting: Family Medicine

## 2019-06-20 NOTE — Telephone Encounter (Signed)
Patient calling in regards to a medication. I believe he was asking for Viagra or something similar. He seemed embarrassed to speak with me about it.   Please contact patient in regards to medication.

## 2019-06-20 NOTE — Telephone Encounter (Signed)
Please verify what patient is requesting to be sent to pharmacy. Thanks!

## 2019-06-21 NOTE — Telephone Encounter (Signed)
Spoke with patient. He is requesting a Rx to help with ED. Says that he's having issues due to "all of his BP medications".  Please advise.

## 2019-06-23 ENCOUNTER — Other Ambulatory Visit: Payer: Self-pay | Admitting: Nurse Practitioner

## 2019-06-23 MED ORDER — SILDENAFIL CITRATE 50 MG PO TABS: 50.0000 mg | ORAL_TABLET | Freq: Every day | ORAL | 0 refills | Status: DC | PRN

## 2019-06-23 NOTE — Telephone Encounter (Signed)
Medication sent.

## 2019-07-27 ENCOUNTER — Telehealth: Payer: Self-pay | Admitting: Family Medicine

## 2019-08-14 ENCOUNTER — Telehealth: Payer: Self-pay

## 2019-08-14 NOTE — Telephone Encounter (Signed)
Called patient to do their pre-visit COVID screening.  Patient states that he needs to reschedule appointment. Appointment moved to 09/06/2019 at 2:30 pm.

## 2019-08-15 ENCOUNTER — Ambulatory Visit: Payer: BC Managed Care – PPO | Admitting: Internal Medicine

## 2019-08-23 ENCOUNTER — Encounter: Payer: Self-pay | Admitting: General Practice

## 2019-09-04 NOTE — Telephone Encounter (Signed)
error 

## 2019-09-05 ENCOUNTER — Telehealth: Payer: Self-pay

## 2019-09-05 NOTE — Telephone Encounter (Signed)
Called patient to do their pre-visit COVID screening.  Call went to voicemail. Unable to do prescreening.  

## 2019-09-06 ENCOUNTER — Ambulatory Visit: Payer: BC Managed Care – PPO | Admitting: Internal Medicine

## 2020-03-26 NOTE — Telephone Encounter (Signed)
error 

## 2020-05-14 IMAGING — MR MR HEAD W/O CM
8 of 10 series · 39 of 48 positions shown · non-contrast
Comparison: Head CT 08/03/2012

CLINICAL DATA: Vertigo.  Near syncope.  Headache for 2-3 weeks.

EXAM:
MRI HEAD WITHOUT CONTRAST
TECHNIQUE: Multiplanar, multiecho pulse sequences of the brain and surrounding
structures were obtained without intravenous contrast.

[Series 3: DWI · axial · 3.0mm · 1.09mm/px · z∈[-18,+140]mm · 11 of 108 slices shown (1 of 4)]
[im 1/108]
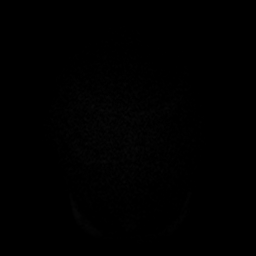
[im 11/108]
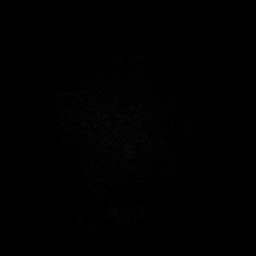
[im 22/108]
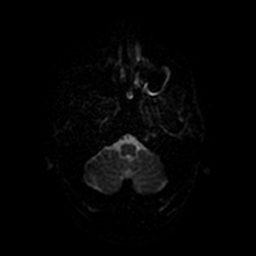
[im 33/108]
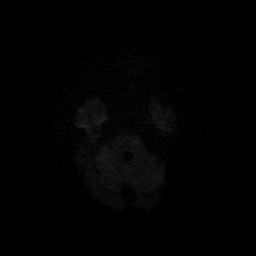
[im 43/108]
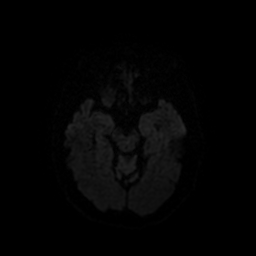
[im 54/108]
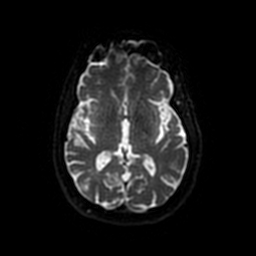
[im 65/108]
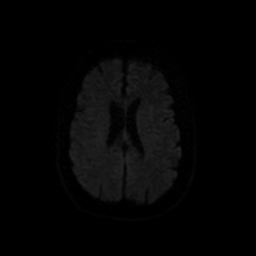
[im 75/108]
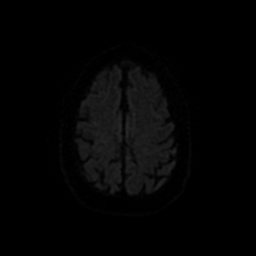
[im 86/108]
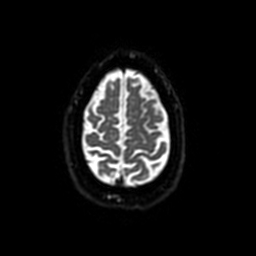
[im 97/108]
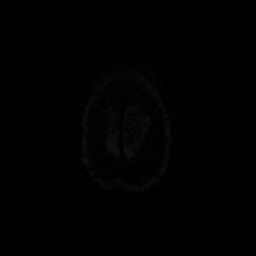
[im 108/108]
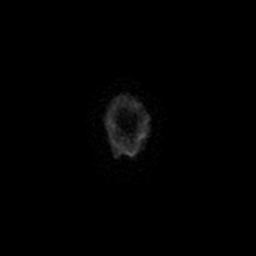

[Series 4: T1 · sagittal · 5.0mm · 0.47mm/px · 1 of 24 slices shown]
[im 1/24]
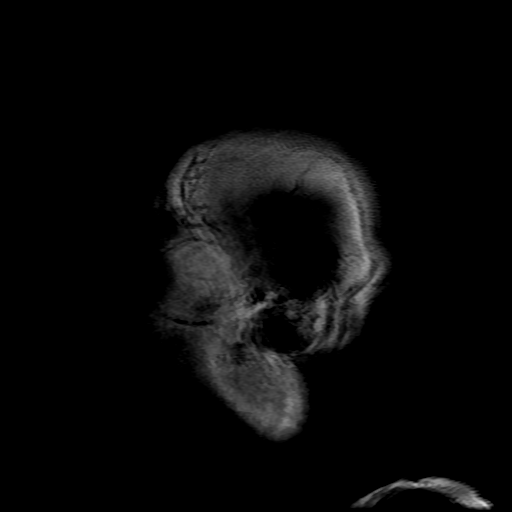

[Series 5: DWI · coronal · 4.0mm · 1.09mm/px · 9 of 90 slices shown (2 of 4)]
[im 1/90]
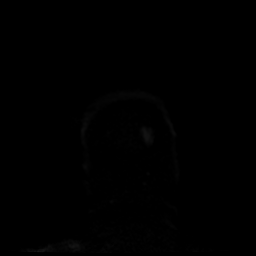
[im 12/90]
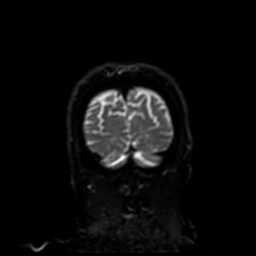
[im 23/90]
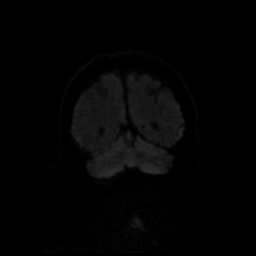
[im 34/90]
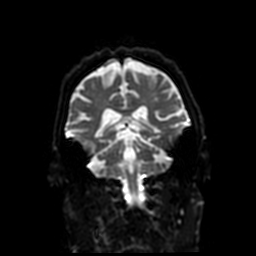
[im 45/90]
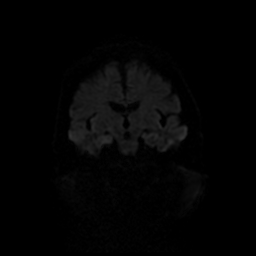
[im 56/90]
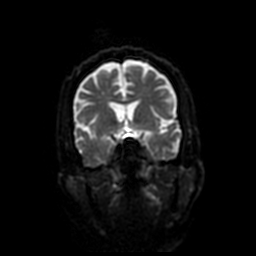
[im 67/90]
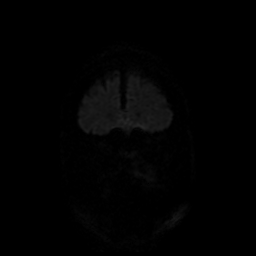
[im 78/90]
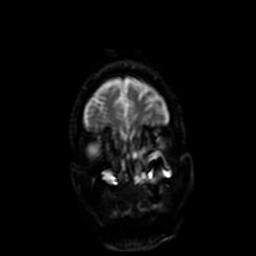
[im 90/90]
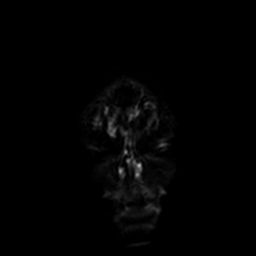

[Series 6: T2 · axial · 5.0mm · 0.43mm/px · z∈[-31,+130]mm · 3 of 28 slices shown (1 of 2)]
[im 1/28]
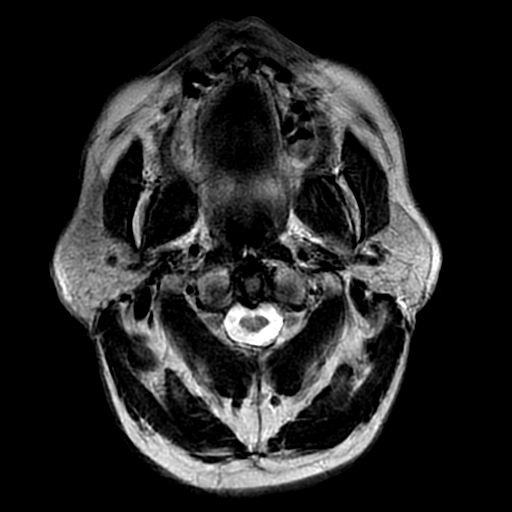
[im 14/28]
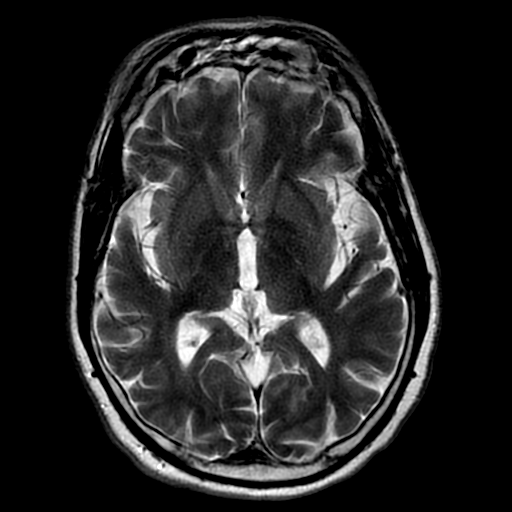
[im 28/28]
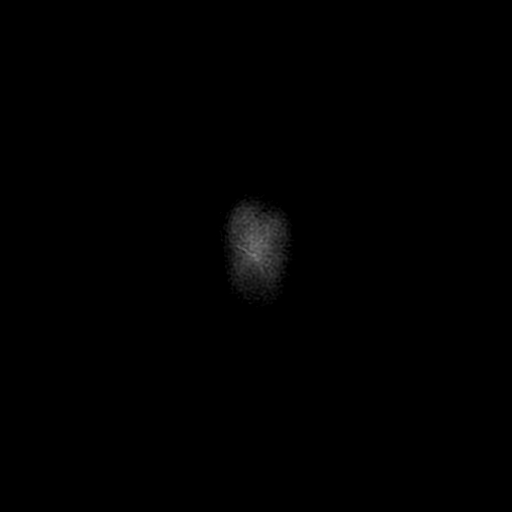

[Series 7: FLAIR · axial · 3.0mm · 0.43mm/px · z∈[-31,+130]mm · 3 of 28 slices shown]
[im 1/28]
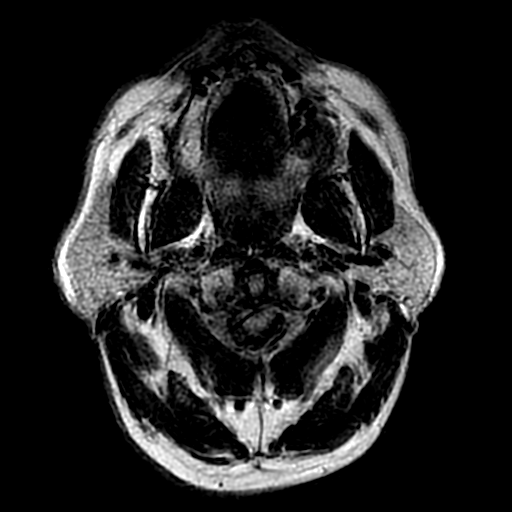
[im 14/28]
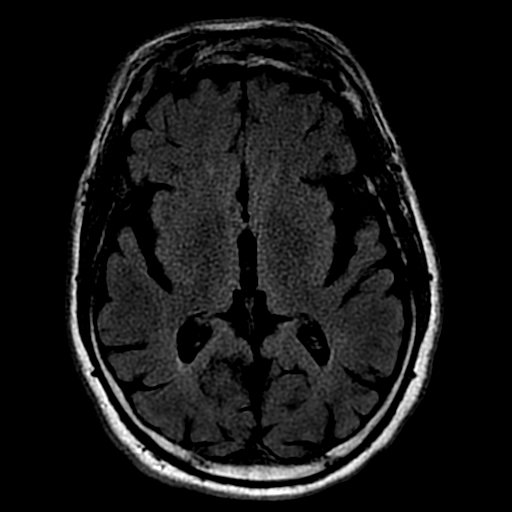
[im 28/28]
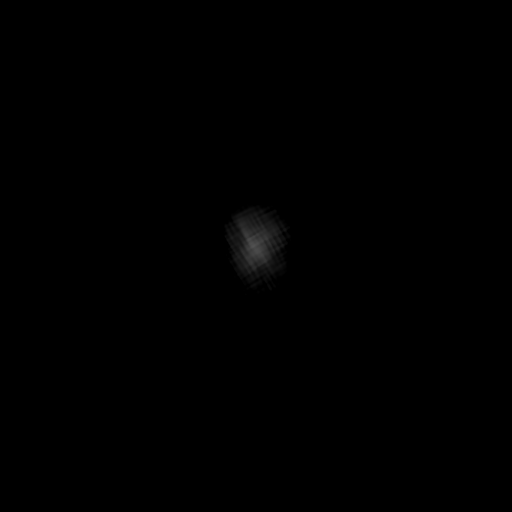

[Series 10: T2 · coronal · 5.0mm · 0.45mm/px · 3 of 29 slices shown (2 of 2)]
[im 1/29]
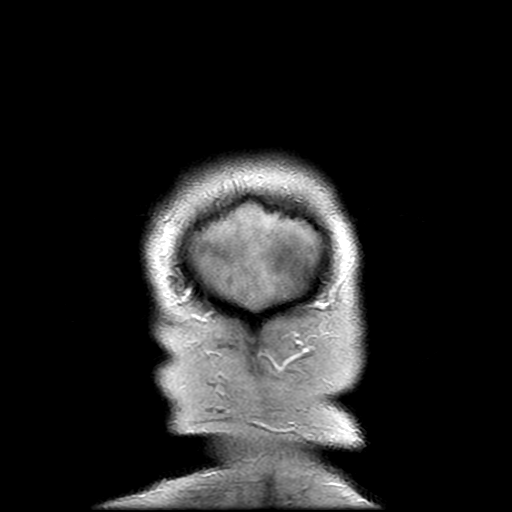
[im 15/29]
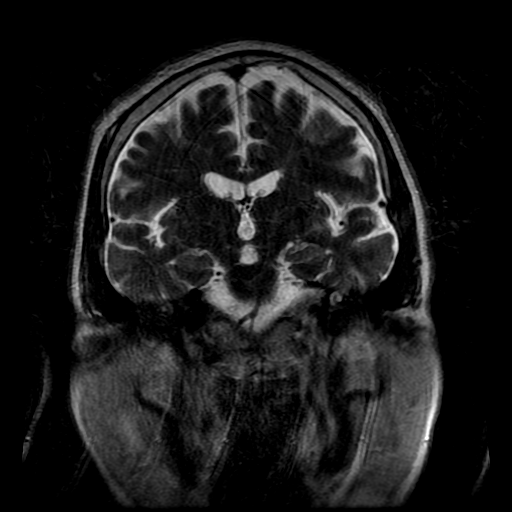
[im 29/29]
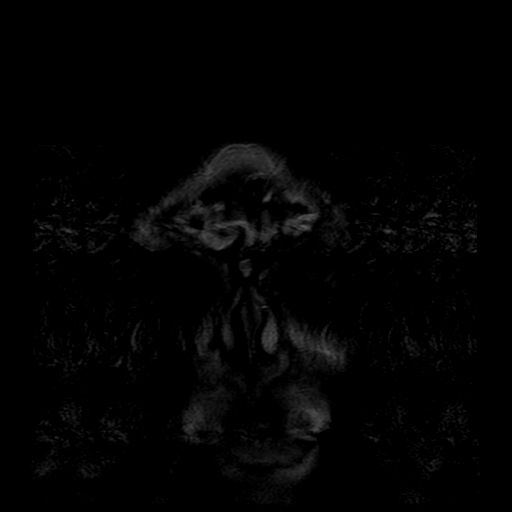

[Series 300: DWI · axial · 3.0mm · 1.09mm/px · z∈[-18,+140]mm · 5 of 53 slices shown (3 of 4)]
[im 1/53]
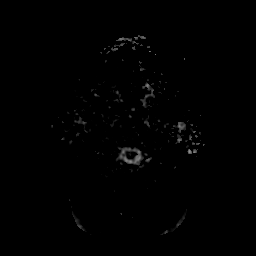
[im 14/53]
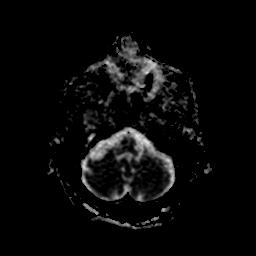
[im 27/53]
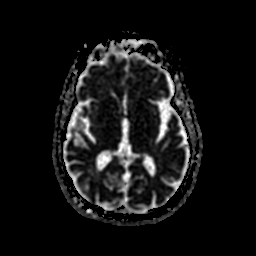
[im 40/53]
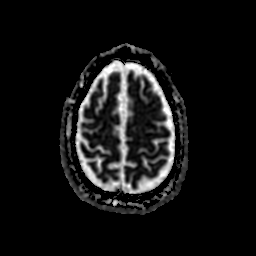
[im 53/53]
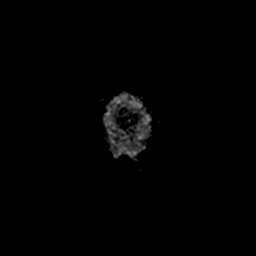

[Series 500: DWI · coronal · 4.0mm · 1.09mm/px · 4 of 45 slices shown (4 of 4)]
[im 1/45]
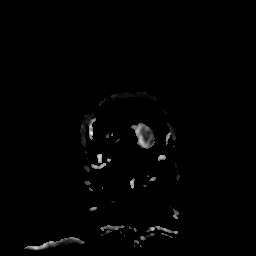
[im 15/45]
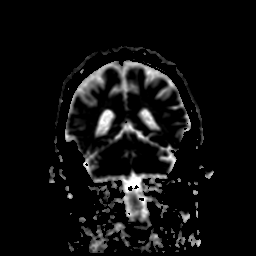
[im 30/45]
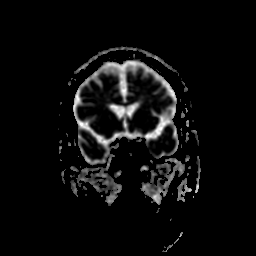
[im 45/45]
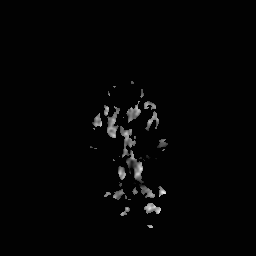

[39 of 48 positions shown; findings below may reference images not displayed]

FINDINGS: Multiple sequences are moderately motion degraded.

Brain: There is no evidence of acute infarct, intracranial
hemorrhage, mass, midline shift, or extra-axial fluid collection.
There is mild cerebral atrophy. Minimal hazy FLAIR hyperintensity
most notable in the periatrial white matter bilaterally is
nonspecific and not necessarily abnormal for age.

Vascular: Major intracranial vascular flow voids are preserved.

Skull and upper cervical spine: Unremarkable bone marrow signal.

Sinuses/Orbits: Unremarkable orbits. Moderate mucosal thickening in
the paranasal sinuses bilaterally. Clear mastoid air cells.

Other: None.
IMPRESSION: 1. Motion degraded examination without evidence of acute
intracranial abnormality.
2. Mild cerebral atrophy.

## 2023-09-03 ENCOUNTER — Emergency Department (HOSPITAL_COMMUNITY)
Admission: EM | Admit: 2023-09-03 | Discharge: 2023-09-03 | Disposition: A | Discharge status: Home / Self Care | Payer: Self-pay | Attending: Emergency Medicine | Admitting: Emergency Medicine

## 2023-09-03 DIAGNOSIS — I10 Essential (primary) hypertension: Secondary | ICD-10-CM | POA: Insufficient documentation

## 2023-09-03 DIAGNOSIS — R040 Epistaxis: Secondary | ICD-10-CM | POA: Insufficient documentation

## 2023-09-03 DIAGNOSIS — Z760 Encounter for issue of repeat prescription: Secondary | ICD-10-CM | POA: Diagnosis not present

## 2023-09-03 MED ORDER — AMLODIPINE BESYLATE 10 MG PO TABS: 10.0000 mg | ORAL_TABLET | Freq: Every day | ORAL | 0 refills | Status: DC

## 2023-09-03 NOTE — ED Provider Notes (Signed)
 [place] EMERGENCY DEPARTMENT AT [facility] Provider Note   CSN: [id number] Arrival date & time: 09/03/23  1252     History Chief Complaint  Patient presents with   Epistaxis    CARLITO BOGERT is a 58 y.o. male.  Patient with past significant for hypertension presents to the emergency department today with concerns of a nosebleed.  Reports he had 1 nosebleed yesterday which resolved after direct pressure and use of a nasal decongestant spray.  Patient states he had a repeat episode of nosebleed while at a funeral today.  States he has had increased irritation in his nasal passages from seasonal allergies.  Denies any digital trauma to the nose.  Does report that he has been blowing his nose more frequently.  He is not on any blood thinners and denies any feelings of lightheadedness or dizziness.  Currently denies any chest pain, shortness of breath, vision changes, or severe headache.   Epistaxis      Home Medications Prior to Admission medications   Medication Sig Start Date End Date Taking? Authorizing Provider  amLODipine (NORVASC) 10 MG tablet Take 1 tablet (10 mg total) by mouth daily. 09/03/23  Yes Maryanna Shape A, PA-C  amLODipine (NORVASC) 10 MG tablet Take 1 tablet (10 mg total) by mouth daily. 05/23/19   Claiborne Rigg, NP  atorvastatin (LIPITOR) 40 MG tablet Take 1 tablet (40 mg total) by mouth daily at 6 PM. 05/23/19   Claiborne Rigg, NP  Blood Glucose Monitoring Suppl (ONETOUCH VERIO) w/Device KIT Use as instructed. Inject into the skin once daily. 03/14/19   Claiborne Rigg, NP  carvedilol (COREG) 25 MG tablet Take 1 tablet (25 mg total) by mouth 2 (two) times daily. 05/23/19   Claiborne Rigg, NP  glimepiride (AMARYL) 4 MG tablet Take 1 tablet (4 mg total) by mouth daily before breakfast. 05/23/19   Claiborne Rigg, NP  glucose blood test strip Use as instructed. Inject into the skin twice daily.  ICD-10 E10.9, E11.9 03/14/19   Claiborne Rigg, NP   ipratropium (ATROVENT) 0.03 % nasal spray Place 2 sprays into both nostrils 2 (two) times daily. 09/27/18   Bing Neighbors, NP  Lancet Devices (ONE TOUCH DELICA LANCING DEV) MISC Use as instructed. Inject into the skin twice daily.  ICD-10 E10.9, E11.9 03/14/19   Claiborne Rigg, NP  losartan (COZAAR) 50 MG tablet Take 1 tablet (50 mg total) by mouth daily. 03/12/19 06/10/19  O'NealRonnald Ramp, MD  meclizine (ANTIVERT) 25 MG tablet Take 1 tablet (25 mg total) by mouth 3 (three) times daily as needed for dizziness. 05/23/19   Claiborne Rigg, NP  metFORMIN (GLUCOPHAGE) 1000 MG tablet Take 1 tablet (1,000 mg total) by mouth 2 (two) times daily with a meal. 05/23/19   Claiborne Rigg, NP  sildenafil (VIAGRA) 50 MG tablet Take 1-2 tablets (50-100 mg total) by mouth daily as needed for erectile dysfunction. 06/23/19   Claiborne Rigg, NP      Allergies    Patient has no known allergies.    Review of Systems   Review of Systems  HENT:  Positive for nosebleeds.   All other systems reviewed and are negative.   Physical Exam Updated Vital Signs BP (!) 151/113 (BP Location: Right Arm)   Pulse 93   Temp 98.4 F (36.9 C) (Oral)   Resp 18   Ht 5\' 11"  (1.803 m)   Wt 90.7 kg   SpO2  97%   BMI 27.89 kg/m  Physical Exam Vitals and nursing note reviewed.  Constitutional:      General: He is not in acute distress.    Appearance: He is well-developed.  HENT:     Head: Normocephalic and atraumatic.     Nose: Nose normal. No congestion or rhinorrhea.     Comments: Right nares with dilated vessels in the anterior region consistent with anterior nose bleed. Not actively bleeding. Eyes:     Conjunctiva/sclera: Conjunctivae normal.  Cardiovascular:     Rate and Rhythm: Normal rate and regular rhythm.     Heart sounds: No murmur heard. Pulmonary:     Effort: Pulmonary effort is normal. No respiratory distress.     Breath sounds: Normal breath sounds.  Abdominal:     Palpations: Abdomen is  soft.     Tenderness: There is no abdominal tenderness.  Musculoskeletal:        General: No swelling.     Cervical back: Neck supple.  Skin:    General: Skin is warm and dry.     Capillary Refill: Capillary refill takes less than 2 seconds.  Neurological:     Mental Status: He is alert.  Psychiatric:        Mood and Affect: Mood normal.     ED Results / Procedures / Treatments   Labs (all labs ordered are listed, but only abnormal results are displayed) Labs Reviewed - No data to display  EKG None  Radiology No results found.  Procedures Procedures    Medications Ordered in ED Medications - No data to display  ED Course/ Medical Decision Making/ A&P                                 Medical Decision Making  This patient presents to the ED for concern of nosebleed. Differential diagnosis includes digital trauma, seasonal allergies, HTN   Problem List / ED Course:  Patient presents emergency department today with concerns of a nosebleed.  Patient has a past history significant for hypertension on manage at this time.  States he previously was on amlodipine and Coreg but has been off his medications for several days.  No lower extremity 1 episode of aphasia.  Discussed with yesterday successfully subsided after use of a decongestant nasal spray.  And recurrent episode today while at a funeral on the urine but last about 10 minutes.  He denies any feelings of dizziness, tenderness.  Not on blood thinners. Physical exam reveals no active bleeding at this time.  There are dilated vessels in the anterior right nares present with no active bleeding. With no active bleeding at this time, and indication for nasal packing.  Advised patient to continue to monitor for bleeding at home and use nasal decongestant spray such as Afrin if needed. Refill of patient's amlodipine has been sent to his pharmacy.  Advised patient to use this medication as previously prescribed.  Return  precautions discussed.  Patient otherwise stable for outpatient follow-up and discharged home.  Final Clinical Impression(s) / ED Diagnoses Final diagnoses:  Right-sided epistaxis  Hypertension, unspecified type  Encounter for medication refill    Rx / DC Orders ED Discharge Orders          Ordered    amLODipine (NORVASC) 10 MG tablet  Daily        09/03/23 1444  Smitty Knudsen, PA-C 09/03/23 1449    Margarita Grizzle, MD 09/08/23 1150

## 2023-09-03 NOTE — ED Triage Notes (Signed)
 Patients reports nose began bleeding yesterday , at a funeral today and nose began bleeding again. Denies pain, headache. States he has nasal spray but did not take today

## 2023-09-03 NOTE — Discharge Instructions (Addendum)
 You were seen in the ER today for concerns of a nosebleed. Your bleeding was able to stop on its own prior to arriving but I would recommend keeping a bottle of Afrin nasal spray available to use if bleeding were to restart. For nasal passage moisturization, please use a nasal saline gel or spray to reduce irritation.   I have sent a refill of your blood pressure medication, Amlodipine, to your pharmacy. Please take this as prescribed.

## 2023-10-14 ENCOUNTER — Encounter: Payer: Self-pay | Admitting: Student in an Organized Health Care Education/Training Program

## 2023-10-14 ENCOUNTER — Ambulatory Visit (INDEPENDENT_AMBULATORY_CARE_PROVIDER_SITE_OTHER): Admitting: Student in an Organized Health Care Education/Training Program

## 2023-10-14 VITALS — BP 148/88 | HR 101 | Temp 98.7°F | Ht 70.5 in | Wt 178.8 lb

## 2023-10-14 DIAGNOSIS — I10 Essential (primary) hypertension: Secondary | ICD-10-CM | POA: Diagnosis not present

## 2023-10-14 DIAGNOSIS — F1090 Alcohol use, unspecified, uncomplicated: Secondary | ICD-10-CM | POA: Insufficient documentation

## 2023-10-14 DIAGNOSIS — I499 Cardiac arrhythmia, unspecified: Secondary | ICD-10-CM | POA: Insufficient documentation

## 2023-10-14 DIAGNOSIS — F17200 Nicotine dependence, unspecified, uncomplicated: Secondary | ICD-10-CM

## 2023-10-14 DIAGNOSIS — E782 Mixed hyperlipidemia: Secondary | ICD-10-CM | POA: Diagnosis not present

## 2023-10-14 DIAGNOSIS — R040 Epistaxis: Secondary | ICD-10-CM | POA: Diagnosis not present

## 2023-10-14 MED ORDER — AMLODIPINE-OLMESARTAN 5-20 MG PO TABS: 1.0000 | ORAL_TABLET | Freq: Every day | ORAL | 1 refills | Status: AC

## 2023-10-14 NOTE — Assessment & Plan Note (Signed)
 Chronic problem.  Patient is precontemplative about alcohol  cessation.  I offered him medication assisted treatments.  No signs of cirrhosis on exam.  Will check liver enzymes.

## 2023-10-14 NOTE — Assessment & Plan Note (Signed)
 Recent problem over the last few weeks of recurrent epistaxis.  He is treating this supportively.  Likely hypertension which we are going to manage with antihypertensive medication.  He is also at risk for thrombocytopenia given alcohol  use.  Will check platelets along with coags.

## 2023-10-14 NOTE — Progress Notes (Addendum)
 New Patient Office Visit  Subjective    Patient ID: Zachary Campos, male    DOB: Aug 01, 1965  Age: 58 y.o. MRN: [id number]  CC:   Chief Complaint  Patient presents with   Establish Care    Pt notes doing okay, notes has Hypertension notes has needed medication intermittently.      HPI  Zachary Campos presents to establish care  58 year old person here for management of hypertension.  It has been many years since he has seen a primary care physician.  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  Lives with his wife.  Very functional, has a moderate alcohol  use disorder.  Over the last few weeks he has had problems with recurrent epistaxis.  Nosebleeds would not stop with pressure or supportive interventions at home.  Led to a visit with the emergency departments, had a consultation with ENT.  Started on antihypertensive by the emergency departments, ran out of that medicine earlier this week.  Denies any recent illnesses.  No recent hospitalizations.  He did have a fall at home associated with intoxication, caused a large laceration on his left eyebrow, but he did not go to emergency department, did not have sutures placed and it healed by secondary intention.  Denies any chest pain or shortness of breath with exertion.  No fevers or chills.  No symptoms of alcohol  withdrawal.  Smokes about 5 cigarettes daily, has been smoking over 30 years.    Outpatient Encounter Medications as of 10/14/2023  Medication Sig   amLODipine -olmesartan (AZOR) 5-20 MG tablet Take 1 tablet by mouth daily.   [DISCONTINUED] amLODipine  (NORVASC ) 10 MG tablet Take 1 tablet (10 mg total) by mouth daily.   [DISCONTINUED] amLODipine  (NORVASC ) 10 MG tablet Take 1 tablet (10 mg total) by mouth daily.   [DISCONTINUED] atorvastatin  (LIPITOR) 40 MG tablet Take 1 tablet (40 mg total) by mouth daily at 6 PM.   [DISCONTINUED] Blood Glucose Monitoring Suppl (ONETOUCH VERIO) w/Device KIT Use as instructed. Inject into the  skin once daily.   [DISCONTINUED] carvedilol  (COREG ) 25 MG tablet Take 1 tablet (25 mg total) by mouth 2 (two) times daily.   [DISCONTINUED] glimepiride  (AMARYL ) 4 MG tablet Take 1 tablet (4 mg total) by mouth daily before breakfast.   [DISCONTINUED] glucose blood test strip Use as instructed. Inject into the skin twice daily.  ICD-10 E10.9, E11.9   [DISCONTINUED] ipratropium (ATROVENT ) 0.03 % nasal spray Place 2 sprays into both nostrils 2 (two) times daily.   [DISCONTINUED] Lancet Devices (ONE TOUCH DELICA LANCING DEV) MISC Use as instructed. Inject into the skin twice daily.  ICD-10 E10.9, E11.9   [DISCONTINUED] losartan  (COZAAR ) 50 MG tablet Take 1 tablet (50 mg total) by mouth daily.   [DISCONTINUED] meclizine  (ANTIVERT ) 25 MG tablet Take 1 tablet (25 mg total) by mouth 3 (three) times daily as needed for dizziness.   [DISCONTINUED] metFORMIN  (GLUCOPHAGE ) 1000 MG tablet Take 1 tablet (1,000 mg total) by mouth 2 (two) times daily with a meal.   [DISCONTINUED] sildenafil  (VIAGRA ) 50 MG tablet Take 1-2 tablets (50-100 mg total) by mouth daily as needed for erectile dysfunction.   No facility-administered encounter medications on file as of 10/14/2023.    Past Medical History:  Diagnosis Date   Epistaxis 08/07/2012   Hypercholesterolemia 08/08/2012   Lipid panel 08/07/2012: Cholesterol 245, triglycerides 287, HDL 46, LDL 142 (10-yr CVD risk 9.7%).    Hypertension     History reviewed. No pertinent surgical history.  Family History  Problem Relation Age of Onset   Bladder Cancer Mother    Hypertension Mother    Hyperlipidemia Mother    Heart disease Neg Hx    Stroke Neg Hx    Diabetes Neg Hx          Objective    BP (!) 148/88   Pulse (!) 101   Temp 98.7 F (37.1 C) (Temporal)   Ht 5' 10.5" (1.791 m)   Wt 178 lb 12.8 oz (81.1 kg)   SpO2 97%   BMI 25.29 kg/m   Physical Exam  Gen: Well-appearing Eyes: There is a scar on his left eyebrow, looks like it was an open  laceration that healed by secondary intention, eyes appear normal otherwise Ears: Normal bilateral tympanic membranes Mouth: Multiple missing teeth and a few untreated caries, otherwise no oral lesions Neck: No nodules, normal thyroid , no adenopathy Heart: Mildly irregular, no murmur Lungs: Unlabored, clear throughout, no wheezing or crackles Chest: No gynecomastia Abd: Soft, nontender, no organomegaly Ext: Warm, well-perfused, no edema Skin: 2 mm skin tag on his anterior chest, 1 cm seborrheic keratosis on his upper back, no angiomas or varicosities Psych: Very pleasant to talk with, not anxious or depressed appearing Neuro: Alert, conversational, full strength upper and lower extremity, normal gait  EKG: Obtained due to irregular rhythm on auscultation, at risk for atrial fibrillation, EKG shows normal sinus rhythm, mild respiratory variation, normal intervals, normal axis, no ST or T wave changes, no Q waves.  Overall reassuring.     Assessment & Plan:   Problem List Items Addressed This Visit       High   Essential hypertension - Primary (Chronic)   Blood pressure elevated today.  Tolerated amlodipine  well but currently out of it over the last few days.  I think organ to need 2 agents to control his blood pressure, especially given recent epistaxis.  Will plan to use combination olmesartan and amlodipine .      Relevant Medications   amLODipine -olmesartan (AZOR) 5-20 MG tablet   Other Relevant Orders   Comprehensive metabolic panel with GFR   Hemoglobin A1c   Lipid panel     Medium    Tobacco use disorder (Chronic)   Chronic problem.  Patient is precontemplative about cessation.  I offered him medication assisted treatment with Chantix.  Patient declines for now.  But will call me if he is interested in the future.      Hyperlipidemia (Chronic)   Relevant Medications   amLODipine -olmesartan (AZOR) 5-20 MG tablet   Alcohol  use disorder (Chronic)   Chronic problem.   Patient is precontemplative about alcohol  cessation.  I offered him medication assisted treatments.  No signs of cirrhosis on exam.  Will check liver enzymes.      Relevant Orders   Comprehensive metabolic panel with GFR     Low   Epistaxis (Chronic)   Recent problem over the last few weeks of recurrent epistaxis.  He is treating this supportively.  Likely hypertension which we are going to manage with antihypertensive medication.  He is also at risk for thrombocytopenia given alcohol  use.  Will check platelets along with coags.      Relevant Orders   CBC with Differential/Platelet   Protime-INR   APTT     Unprioritized   Irregular heart beat   Regular heartbeat heard on exam.  Patient at risk for A-fib due to alcohol  use.  EKG is reassuring, normal sinus rhythm.  Relevant Orders   EKG 12-Lead    Return in about 4 weeks (around 11/11/2023) for hypertension management.   Zachary Hercules, MD

## 2023-10-14 NOTE — Assessment & Plan Note (Signed)
 Blood pressure elevated today.  Tolerated amlodipine  well but currently out of it over the last few days.  I think organ to need 2 agents to control his blood pressure, especially given recent epistaxis.  Will plan to use combination olmesartan and amlodipine .

## 2023-10-14 NOTE — Assessment & Plan Note (Signed)
 Regular heartbeat heard on exam.  Patient at risk for A-fib due to alcohol  use.  EKG is reassuring, normal sinus rhythm.

## 2023-10-14 NOTE — Assessment & Plan Note (Signed)
 Chronic problem.  Patient is precontemplative about cessation.  I offered him medication assisted treatment with Chantix.  Patient declines for now.  But will call me if he is interested in the future.

## 2023-11-11 ENCOUNTER — Ambulatory Visit: Admitting: Student in an Organized Health Care Education/Training Program

## 2023-11-17 ENCOUNTER — Ambulatory Visit: Admitting: Student in an Organized Health Care Education/Training Program

## 2023-11-30 ENCOUNTER — Ambulatory Visit: Admitting: Family

## 2023-12-14 ENCOUNTER — Ambulatory Visit: Admission: EM | Admit: 2023-12-14 | Discharge: 2023-12-14 | Disposition: A | Discharge status: Home / Self Care

## 2023-12-14 ENCOUNTER — Encounter: Payer: Self-pay | Admitting: Emergency Medicine

## 2023-12-14 DIAGNOSIS — I10 Essential (primary) hypertension: Secondary | ICD-10-CM

## 2023-12-14 NOTE — ED Provider Notes (Signed)
 [facility]    CSN: [id number] Arrival date & time: 12/14/23  0804      History   Chief Complaint Chief Complaint  Patient presents with   Dizziness    HPI Zachary Campos is a 58 y.o. male.   Here today for evaluation of dizziness that occurred a few days ago.  He reports that he was at work and he works in a Psychologist, counselling and states he started to feel lightheaded and dizzy and was told that his blood pressure was low when they checked it.  He denies any symptoms today and reports that dizziness and lightheadedness has resolved.  He does report that he is out of his hypertension medication but did take last dose yesterday.  He denies any chest pain or shortness of breath.  The history is provided by the patient.    Past Medical History:  Diagnosis Date   Epistaxis 08/07/2012   Hypercholesterolemia 08/08/2012   Lipid panel 08/07/2012: Cholesterol 245, triglycerides 287, HDL 46, LDL 142 (10-yr CVD risk 9.7%).    Hypertension     Patient Active Problem List   Diagnosis Date Noted   Irregular heart beat 10/14/2023   Alcohol  use disorder 10/14/2023   Essential hypertension 06/27/2018   Hyperlipidemia 08/08/2012   Epistaxis 08/07/2012   Tobacco use disorder 08/07/2012    History reviewed. No pertinent surgical history.     Home Medications    Prior to Admission medications   Medication Sig Start Date End Date Taking? Authorizing Provider  amLODipine -olmesartan  (AZOR ) 5-20 MG tablet Take 1 tablet by mouth daily. Patient not taking: Reported on 12/14/2023 10/14/23   Jerrell Cleatus Ned, MD    Family History Family History  Problem Relation Age of Onset   Bladder Cancer Mother    Hypertension Mother    Hyperlipidemia Mother    Heart disease Neg Hx    Stroke Neg Hx    Diabetes Neg Hx     Social History Social History   Tobacco Use   Smoking status: Every Day    Current packs/day: 0.50    Average packs/day: 0.5 packs/day for 35.0 years (17.5 ttl  pk-yrs)    Types: Cigarettes   Smokeless tobacco: Never  Vaping Use   Vaping status: Never Used  Substance Use Topics   Alcohol  use: Yes    Comment: averages 1 40oz beer every evening   Drug use: No     Allergies   Patient has no known allergies.   Review of Systems Review of Systems  Constitutional:  Negative for chills and fever.  Eyes:  Negative for discharge and redness.  Respiratory:  Negative for shortness of breath.   Cardiovascular:  Negative for chest pain.  Neurological:  Positive for dizziness and light-headedness. Negative for numbness.     Physical Exam Triage Vital Signs ED Triage Vitals  Encounter Vitals Group     BP      Girls Systolic BP Percentile      Girls Diastolic BP Percentile      Boys Systolic BP Percentile      Boys Diastolic BP Percentile      Pulse      Resp      Temp      Temp src      SpO2      Weight      Height      Head Circumference      Peak Flow      Pain Score  Pain Loc      Pain Education      Exclude from Growth Chart    No data found.  Updated Vital Signs BP 120/81 (BP Location: Left Arm)   Pulse 98   Temp 97.9 F (36.6 C) (Oral)   Resp 18   SpO2 94%   Visual Acuity Right Eye Distance:   Left Eye Distance:   Bilateral Distance:    Right Eye Near:   Left Eye Near:    Bilateral Near:     Physical Exam Vitals and nursing note reviewed.  Constitutional:      General: He is not in acute distress.    Appearance: Normal appearance. He is not ill-appearing.  HENT:     Head: Normocephalic and atraumatic.  Eyes:     Conjunctiva/sclera: Conjunctivae normal.  Cardiovascular:     Rate and Rhythm: Normal rate and regular rhythm.  Pulmonary:     Effort: Pulmonary effort is normal. No respiratory distress.     Breath sounds: No wheezing, rhonchi or rales.  Neurological:     Mental Status: He is alert.  Psychiatric:        Mood and Affect: Mood normal.        Behavior: Behavior normal.        Thought  Content: Thought content normal.      UC Treatments / Results  Labs (all labs ordered are listed, but only abnormal results are displayed) Labs Reviewed - No data to display  EKG   Radiology No results found.  Procedures Procedures (including critical care time)  Medications Ordered in UC Medications - No data to display  Initial Impression / Assessment and Plan / UC Course  I have reviewed the triage vital signs and the nursing notes.  Pertinent labs & imaging results that were available during my care of the patient were reviewed by me and considered in my medical decision making (see chart for details).    Discussed that blood pressure in office was normal without treatment, question if treatment for hypertension is causing low blood pressure that is symptomatic.  Recommended he hold medication and follow-up with primary care soon as possible.  Patient appointment may before he left urgent care today for 8 days from today.  Encouraged him to monitor his blood pressure at home and log for PCP.  Advised follow-up in office sooner with any elevated blood pressure readings or other symptoms while awaiting PCP visit.  Final Clinical Impressions(s) / UC Diagnoses   Final diagnoses:  Essential hypertension   Discharge Instructions   None    ED Prescriptions   None    PDMP not reviewed this encounter.   Billy Asberry FALCON, PA-C 12/14/23 1046

## 2023-12-14 NOTE — ED Triage Notes (Signed)
 Pt st's he was at work on Monday and became dizzy   St's they checked his b/p at work and told him that it was low.  Pt st's yesterday he also was dizzy.  Today pt st's dizziness has subsided but he is out of his HTN meds.

## 2023-12-22 ENCOUNTER — Ambulatory Visit: Admitting: Family Medicine

## 2023-12-22 VITALS — BP 178/109 | HR 97 | Ht 70.5 in | Wt 174.0 lb

## 2023-12-22 DIAGNOSIS — Z7689 Persons encountering health services in other specified circumstances: Secondary | ICD-10-CM

## 2023-12-22 DIAGNOSIS — E1169 Type 2 diabetes mellitus with other specified complication: Secondary | ICD-10-CM

## 2023-12-22 DIAGNOSIS — I10 Essential (primary) hypertension: Secondary | ICD-10-CM | POA: Diagnosis not present

## 2023-12-22 MED ORDER — HYDROCHLOROTHIAZIDE 25 MG PO TABS: 25.0000 mg | ORAL_TABLET | Freq: Every day | ORAL | 0 refills | Status: AC

## 2023-12-22 MED ORDER — AMLODIPINE BESYLATE 10 MG PO TABS: 10.0000 mg | ORAL_TABLET | Freq: Every day | ORAL | 0 refills | Status: AC

## 2023-12-22 NOTE — Progress Notes (Unsigned)
   New Patient Office Visit  Subjective    Patient ID: Zachary Campos, male    DOB: 1966/02/16  Age: 58 y.o. MRN: [id number]  CC:  Chief Complaint  Patient presents with   Establish Care    Blood pressure. Patient has been getting light headed due to his amlodipine .     HPI Zachary Campos presents to establish care   Outpatient Encounter Medications as of 12/22/2023  Medication Sig   amLODipine -olmesartan  (AZOR ) 5-20 MG tablet Take 1 tablet by mouth daily. (Patient not taking: Reported on 12/22/2023)   No facility-administered encounter medications on file as of 12/22/2023.    Past Medical History:  Diagnosis Date   Epistaxis 08/07/2012   Hypercholesterolemia 08/08/2012   Lipid panel 08/07/2012: Cholesterol 245, triglycerides 287, HDL 46, LDL 142 (10-yr CVD risk 9.7%).    Hypertension     No past surgical history on file.  Family History  Problem Relation Age of Onset   Bladder Cancer Mother    Hypertension Mother    Hyperlipidemia Mother    Heart disease Neg Hx    Stroke Neg Hx    Diabetes Neg Hx     Social History   Socioeconomic History   Marital status: Married    Spouse name: Not on file   Number of children: 1   Years of education: Not on file   Highest education level: Not on file  Occupational History   Occupation: Set designer  Tobacco Use   Smoking status: Every Day    Current packs/day: 0.50    Average packs/day: 0.5 packs/day for 35.0 years (17.5 ttl pk-yrs)    Types: Cigarettes   Smokeless tobacco: Never  Vaping Use   Vaping status: Never Used  Substance and Sexual Activity   Alcohol  use: Yes    Comment: averages 1 40oz beer every evening   Drug use: No   Sexual activity: Not on file  Other Topics Concern   Not on file  Social History Narrative   Works at a Editor, commissioning.   Social Drivers of Corporate investment banker Strain: Not on file  Food Insecurity: Not on file  Transportation Needs: Not on file  Physical  Activity: Not on file  Stress: Not on file  Social Connections: Not on file  Intimate Partner Violence: Not on file    ROS      Objective   BP (!) 178/109   Pulse 97   Ht 5' 10.5 (1.791 m)   Wt 174 lb (78.9 kg)   SpO2 94%   BMI 24.61 kg/m   Physical Exam  {Labs (Optional):23779}    Assessment & Plan:   There are no diagnoses linked to this encounter.   No follow-ups on file.   Tanda Raguel SQUIBB, MD

## 2023-12-23 ENCOUNTER — Encounter: Payer: Self-pay | Admitting: Family Medicine

## 2023-12-23 LAB — HEMOGLOBIN A1C
Est. average glucose Bld gHb Est-mCnc: 140 mg/dL
Hgb A1c MFr Bld: 6.5 % — ABNORMAL HIGH (ref 4.8–5.6)

## 2023-12-23 LAB — COMPREHENSIVE METABOLIC PANEL WITH GFR
ALT: 33 IU/L (ref 0–44)
AST: 49 IU/L — ABNORMAL HIGH (ref 0–40)
Albumin: 4.4 g/dL (ref 3.8–4.9)
Alkaline Phosphatase: 57 IU/L (ref 44–121)
BUN/Creatinine Ratio: 14 (ref 9–20)
BUN: 15 mg/dL (ref 6–24)
Bilirubin Total: 0.6 mg/dL (ref 0.0–1.2)
CO2: 22 mmol/L (ref 20–29)
Calcium: 9.6 mg/dL (ref 8.7–10.2)
Chloride: 101 mmol/L (ref 96–106)
Creatinine, Ser: 1.04 mg/dL (ref 0.76–1.27)
Globulin, Total: 3 g/dL (ref 1.5–4.5)
Glucose: 114 mg/dL — ABNORMAL HIGH (ref 70–99)
Potassium: 4.5 mmol/L (ref 3.5–5.2)
Sodium: 141 mmol/L (ref 134–144)
Total Protein: 7.4 g/dL (ref 6.0–8.5)
eGFR: 83 mL/min/1.73 (ref >59)

## 2023-12-23 LAB — LIPID PANEL
Chol/HDL Ratio: 3 ratio (ref 0.0–5.0)
Cholesterol, Total: 254 mg/dL — ABNORMAL HIGH (ref 100–199)
HDL: 86 mg/dL (ref >39)
LDL Chol Calc (NIH): 160 mg/dL — ABNORMAL HIGH (ref 0–99)
Triglycerides: 51 mg/dL (ref 0–149)
VLDL Cholesterol Cal: 8 mg/dL (ref 5–40)

## 2024-01-09 ENCOUNTER — Ambulatory Visit (INDEPENDENT_AMBULATORY_CARE_PROVIDER_SITE_OTHER): Admitting: Family Medicine

## 2024-01-09 ENCOUNTER — Encounter: Payer: Self-pay | Admitting: Family Medicine

## 2024-01-09 VITALS — BP 205/99 | HR 101 | Ht 70.0 in | Wt 175.6 lb

## 2024-01-09 DIAGNOSIS — I10 Essential (primary) hypertension: Secondary | ICD-10-CM | POA: Diagnosis not present

## 2024-01-09 DIAGNOSIS — F17200 Nicotine dependence, unspecified, uncomplicated: Secondary | ICD-10-CM

## 2024-01-09 MED ORDER — LOSARTAN POTASSIUM 100 MG PO TABS: 100.0000 mg | ORAL_TABLET | Freq: Every day | ORAL | 0 refills | Status: AC

## 2024-01-09 NOTE — Progress Notes (Signed)
 Established Patient Office Visit  Subjective    Patient ID: Zachary Campos, male    DOB: 1966-02-05  Age: 58 y.o. MRN: [id number]  CC:  Chief Complaint  Patient presents with   Medical Management of Chronic Issues    HPI Zachary Campos presents for follow up of hypertension. Patient reports med compliance and denies acute complaints.   Outpatient Encounter Medications as of 01/09/2024  Medication Sig   amLODipine  (NORVASC ) 10 MG tablet Take 1 tablet (10 mg total) by mouth daily.   hydrochlorothiazide  (HYDRODIURIL ) 25 MG tablet Take 1 tablet (25 mg total) by mouth daily.   losartan  (COZAAR ) 100 MG tablet Take 1 tablet (100 mg total) by mouth daily.   amLODipine -olmesartan  (AZOR ) 5-20 MG tablet Take 1 tablet by mouth daily. (Patient not taking: Reported on 12/22/2023)   No facility-administered encounter medications on file as of 01/09/2024.    Past Medical History:  Diagnosis Date   Epistaxis 08/07/2012   Hypercholesterolemia 08/08/2012   Lipid panel 08/07/2012: Cholesterol 245, triglycerides 287, HDL 46, LDL 142 (10-yr CVD risk 9.7%).    Hypertension     History reviewed. No pertinent surgical history.  Family History  Problem Relation Age of Onset   Bladder Cancer Mother    Hypertension Mother    Hyperlipidemia Mother    Heart disease Neg Hx    Stroke Neg Hx    Diabetes Neg Hx     Social History   Socioeconomic History   Marital status: Married    Spouse name: Not on file   Number of children: 1   Years of education: Not on file   Highest education level: Not on file  Occupational History   Occupation: Set designer  Tobacco Use   Smoking status: Every Day    Current packs/day: 0.50    Average packs/day: 0.5 packs/day for 35.0 years (17.5 ttl pk-yrs)    Types: Cigarettes   Smokeless tobacco: Never  Vaping Use   Vaping status: Never Used  Substance and Sexual Activity   Alcohol  use: Yes    Comment: averages 1 40oz beer every evening   Drug use: No   Sexual  activity: Not on file  Other Topics Concern   Not on file  Social History Narrative   Works at a [employer], commission[employer].   Social Drivers of Corporate investment banker Strain: Not on file  Food Insecurity: Not on file  Transportation Needs: Not on file  Physical Activity: Not on file  Stress: Not on file  Social Connections: Not on file  Intimate Partner Violence: Not on file    Review of Systems  All other systems reviewed and are negative.       Objective    BP (!) 205/99   Pulse (!) 101   Ht 5' 10 (1.778 m)   Wt 175 lb 9.6 oz (79.7 kg)   SpO2 93%   BMI 25.20 kg/m   Physical Exam Vitals and nursing note reviewed.  Constitutional:      General: He is not in acute distress. Cardiovascular:     Rate and Rhythm: Normal rate and regular rhythm.  Pulmonary:     Effort: Pulmonary effort is normal.     Breath sounds: Normal breath sounds.  Abdominal:     Palpations: Abdomen is soft.     Tenderness: There is no abdominal tenderness.  Neurological:     General: No focal deficit present.     Mental Status: He is alert  and oriented to person, place, and time.         Assessment & Plan:   Uncontrolled hypertension  Tobacco use disorder  Other orders -     Losartan  Potassium; Take 1 tablet (100 mg total) by mouth daily.  Dispense: 90 tablet; Refill: 0     Return in about 2 weeks (around 01/23/2024) for follow up.   Tanda Raguel SQUIBB, MD
